# Patient Record
Sex: Female | Born: 1945
Health system: Southern US, Community
[De-identification: ages and names within clinical notes are randomized; demographics above are authoritative.]

## PROBLEM LIST (undated history)

## (undated) DIAGNOSIS — C73 Malignant neoplasm of thyroid gland: Secondary | ICD-10-CM

## (undated) DIAGNOSIS — K589 Irritable bowel syndrome without diarrhea: Secondary | ICD-10-CM

## (undated) DIAGNOSIS — Z87898 Personal history of other specified conditions: Secondary | ICD-10-CM

## (undated) DIAGNOSIS — K219 Gastro-esophageal reflux disease without esophagitis: Secondary | ICD-10-CM

## (undated) DIAGNOSIS — M797 Fibromyalgia: Secondary | ICD-10-CM

## (undated) DIAGNOSIS — F410 Panic disorder [episodic paroxysmal anxiety] without agoraphobia: Secondary | ICD-10-CM

## (undated) DIAGNOSIS — K859 Acute pancreatitis without necrosis or infection, unspecified: Secondary | ICD-10-CM

## (undated) DIAGNOSIS — Z860100 Personal history of colon polyps, unspecified: Secondary | ICD-10-CM

## (undated) DIAGNOSIS — M542 Cervicalgia: Secondary | ICD-10-CM

## (undated) DIAGNOSIS — F419 Anxiety disorder, unspecified: Secondary | ICD-10-CM

## (undated) DIAGNOSIS — Z8701 Personal history of pneumonia (recurrent): Secondary | ICD-10-CM

## (undated) DIAGNOSIS — I10 Essential (primary) hypertension: Secondary | ICD-10-CM

## (undated) DIAGNOSIS — T7840XA Allergy, unspecified, initial encounter: Secondary | ICD-10-CM

## (undated) DIAGNOSIS — C801 Malignant (primary) neoplasm, unspecified: Secondary | ICD-10-CM

## (undated) DIAGNOSIS — I8390 Asymptomatic varicose veins of unspecified lower extremity: Secondary | ICD-10-CM

## (undated) DIAGNOSIS — R519 Headache, unspecified: Secondary | ICD-10-CM

## (undated) DIAGNOSIS — Z86718 Personal history of other venous thrombosis and embolism: Secondary | ICD-10-CM

## (undated) DIAGNOSIS — K5900 Constipation, unspecified: Secondary | ICD-10-CM

## (undated) HISTORY — DX: Personal history of other specified conditions: Z87.898

## (undated) HISTORY — DX: Malignant neoplasm of thyroid gland: C73

## (undated) HISTORY — DX: Anxiety disorder, unspecified: F41.9

## (undated) HISTORY — DX: Personal history of colonic polyps: Z86.010

## (undated) HISTORY — DX: Personal history of other venous thrombosis and embolism: Z86.718

## (undated) HISTORY — DX: Asymptomatic varicose veins of unspecified lower extremity: I83.90

## (undated) HISTORY — DX: Acute pancreatitis without necrosis or infection, unspecified: K85.90

## (undated) HISTORY — DX: Panic disorder (episodic paroxysmal anxiety): F41.0

## (undated) HISTORY — DX: Irritable bowel syndrome, unspecified: K58.9

## (undated) HISTORY — DX: Malignant (primary) neoplasm, unspecified: C80.1

## (undated) HISTORY — DX: Personal history of colon polyps, unspecified: Z86.0100

## (undated) HISTORY — DX: Allergy, unspecified, initial encounter: T78.40XA

## (undated) HISTORY — PX: NERVE TRANSFER FOR MUSCLE REPAIR, FOREARM: SHX2085

## (undated) HISTORY — DX: Cervicalgia: M54.2

## (undated) HISTORY — DX: Gastro-esophageal reflux disease without esophagitis: K21.9

## (undated) HISTORY — PX: THYROIDECTOMY, PARTIAL: SHX18

## (undated) HISTORY — DX: Fibromyalgia: M79.7

## (undated) HISTORY — DX: Headache, unspecified: R51.9

## (undated) HISTORY — DX: Personal history of pneumonia (recurrent): Z87.01

## (undated) HISTORY — DX: Essential (primary) hypertension: I10

## (undated) HISTORY — PX: BREAST IMPLANT REMOVAL: SUR1101

## (undated) HISTORY — PX: SPINE SURGERY: SHX786

## (undated) HISTORY — DX: Constipation, unspecified: K59.00

## (undated) HISTORY — PX: APPENDECTOMY: SHX54

---

## 1972-02-17 HISTORY — PX: TONSILLECTOMY AND ADENOIDECTOMY: SUR1326

## 1975-02-17 HISTORY — PX: TUBAL LIGATION: SHX77

## 1986-02-16 HISTORY — PX: ABDOMINAL HYSTERECTOMY: SHX81

## 1987-02-17 DIAGNOSIS — C801 Malignant (primary) neoplasm, unspecified: Secondary | ICD-10-CM

## 1987-02-17 HISTORY — PX: MASTECTOMY: SHX3

## 1987-02-17 HISTORY — DX: Malignant (primary) neoplasm, unspecified: C80.1

## 1988-02-17 HISTORY — PX: BREAST ENHANCEMENT SURGERY: SHX7

## 1988-02-17 HISTORY — PX: OTHER SURGICAL HISTORY: SHX169

## 1995-02-17 HISTORY — PX: SHOULDER SURGERY: SHX246

## 1998-08-09 ENCOUNTER — Encounter: Payer: Self-pay | Admitting: Gastroenterology

## 1998-08-09 ENCOUNTER — Ambulatory Visit (HOSPITAL_COMMUNITY): Admission: RE | Admit: 1998-08-09 | Discharge: 1998-08-09 | Payer: Self-pay | Admitting: Gastroenterology

## 1998-08-28 ENCOUNTER — Encounter: Payer: Self-pay | Admitting: Gastroenterology

## 1998-08-28 ENCOUNTER — Ambulatory Visit (HOSPITAL_COMMUNITY): Admission: RE | Admit: 1998-08-28 | Discharge: 1998-08-28 | Payer: Self-pay | Admitting: Gastroenterology

## 1998-09-19 ENCOUNTER — Ambulatory Visit (HOSPITAL_COMMUNITY): Admission: RE | Admit: 1998-09-19 | Discharge: 1998-09-19 | Payer: Self-pay | Admitting: Gastroenterology

## 1998-09-19 ENCOUNTER — Encounter (INDEPENDENT_AMBULATORY_CARE_PROVIDER_SITE_OTHER): Payer: Self-pay | Admitting: Specialist

## 1999-05-15 ENCOUNTER — Ambulatory Visit (HOSPITAL_COMMUNITY): Admission: RE | Admit: 1999-05-15 | Discharge: 1999-05-15 | Payer: Self-pay | Admitting: Gastroenterology

## 1999-05-15 ENCOUNTER — Encounter: Payer: Self-pay | Admitting: Gastroenterology

## 1999-12-25 ENCOUNTER — Encounter: Admission: RE | Admit: 1999-12-25 | Discharge: 1999-12-25 | Payer: Self-pay | Admitting: Gastroenterology

## 1999-12-25 ENCOUNTER — Encounter: Payer: Self-pay | Admitting: Gastroenterology

## 2000-08-05 ENCOUNTER — Other Ambulatory Visit: Admission: RE | Admit: 2000-08-05 | Discharge: 2000-08-05 | Payer: Self-pay | Admitting: *Deleted

## 2000-11-03 ENCOUNTER — Encounter: Admission: RE | Admit: 2000-11-03 | Discharge: 2000-11-03 | Payer: Self-pay | Admitting: Gastroenterology

## 2000-11-03 ENCOUNTER — Encounter: Payer: Self-pay | Admitting: Gastroenterology

## 2001-08-10 ENCOUNTER — Other Ambulatory Visit: Admission: RE | Admit: 2001-08-10 | Discharge: 2001-08-10 | Payer: Self-pay | Admitting: Obstetrics and Gynecology

## 2002-06-21 ENCOUNTER — Encounter
Admission: RE | Admit: 2002-06-21 | Discharge: 2002-09-19 | Payer: Self-pay | Admitting: Physical Medicine & Rehabilitation

## 2002-06-28 ENCOUNTER — Ambulatory Visit (HOSPITAL_COMMUNITY)
Admission: RE | Admit: 2002-06-28 | Discharge: 2002-06-28 | Payer: Self-pay | Admitting: Physical Medicine & Rehabilitation

## 2002-06-28 ENCOUNTER — Encounter: Payer: Self-pay | Admitting: Physical Medicine & Rehabilitation

## 2002-12-06 ENCOUNTER — Encounter
Admission: RE | Admit: 2002-12-06 | Discharge: 2003-03-06 | Payer: Self-pay | Admitting: Physical Medicine & Rehabilitation

## 2003-07-02 ENCOUNTER — Ambulatory Visit (HOSPITAL_COMMUNITY): Admission: RE | Admit: 2003-07-02 | Discharge: 2003-07-02 | Payer: Self-pay | Admitting: Radiology

## 2004-06-09 ENCOUNTER — Ambulatory Visit: Payer: Self-pay | Admitting: Family Medicine

## 2004-07-16 ENCOUNTER — Ambulatory Visit: Payer: Self-pay | Admitting: Family Medicine

## 2004-09-24 ENCOUNTER — Ambulatory Visit: Payer: Self-pay | Admitting: Family Medicine

## 2004-10-02 ENCOUNTER — Ambulatory Visit: Payer: Self-pay | Admitting: Family Medicine

## 2004-10-08 ENCOUNTER — Ambulatory Visit: Payer: Self-pay | Admitting: Family Medicine

## 2004-10-30 ENCOUNTER — Ambulatory Visit: Payer: Self-pay | Admitting: Family Medicine

## 2004-12-01 ENCOUNTER — Ambulatory Visit: Payer: Self-pay | Admitting: Family Medicine

## 2005-01-16 ENCOUNTER — Ambulatory Visit: Payer: Self-pay | Admitting: Family Medicine

## 2005-01-19 ENCOUNTER — Ambulatory Visit: Payer: Self-pay | Admitting: Family Medicine

## 2005-01-27 ENCOUNTER — Ambulatory Visit: Payer: Self-pay | Admitting: Family Medicine

## 2005-02-17 ENCOUNTER — Ambulatory Visit: Payer: Self-pay | Admitting: Family Medicine

## 2005-02-23 ENCOUNTER — Ambulatory Visit: Payer: Self-pay | Admitting: Family Medicine

## 2005-03-12 ENCOUNTER — Ambulatory Visit: Payer: Self-pay | Admitting: Family Medicine

## 2006-02-16 HISTORY — PX: COLONOSCOPY: SHX174

## 2006-02-16 HISTORY — PX: CHOLECYSTECTOMY: SHX55

## 2006-02-16 HISTORY — PX: POLYPECTOMY: SHX149

## 2006-03-25 HISTORY — PX: ESOPHAGOGASTRODUODENOSCOPY: SHX1529

## 2009-01-16 ENCOUNTER — Ambulatory Visit (HOSPITAL_COMMUNITY): Admission: RE | Admit: 2009-01-16 | Discharge: 2009-01-17 | Payer: Self-pay | Admitting: Neurosurgery

## 2009-02-16 HISTORY — PX: BREAST LUMPECTOMY: SHX2

## 2009-05-06 ENCOUNTER — Ambulatory Visit (HOSPITAL_BASED_OUTPATIENT_CLINIC_OR_DEPARTMENT_OTHER): Admission: RE | Admit: 2009-05-06 | Discharge: 2009-05-06 | Payer: Self-pay | Admitting: Surgery

## 2010-03-08 ENCOUNTER — Encounter: Payer: Self-pay | Admitting: Internal Medicine

## 2010-05-07 ENCOUNTER — Encounter: Payer: Self-pay | Admitting: Internal Medicine

## 2010-05-12 LAB — POCT HEMOGLOBIN-HEMACUE: Hemoglobin: 14.1 g/dL (ref 12.0–15.0)

## 2010-05-12 LAB — BASIC METABOLIC PANEL
GFR calc non Af Amer: >60 mL/min (ref >60)
Potassium: 4 mEq/L (ref 3.5–5.1)
Sodium: 140 mEq/L (ref 135–145)

## 2010-05-12 LAB — GLUCOSE, CAPILLARY: Glucose-Capillary: 114 mg/dL — ABNORMAL HIGH (ref 70–99)

## 2010-05-15 NOTE — Letter (Signed)
Summary: Colonoscopy Date Change Letter  [facility]  [address] [place], [place] [address]   Phone: [phone]  Fax: [phone]      May 07, 2010 MRN: [id number]   Danielle Mccoy [address] [place], [place]  [address]   Dear [name],   Previously you were recommended to have a repeat colonoscopy around this time. Your chart was recently reviewed by Dr. Lina Sar of [facility]. Follow up colonoscopy is now recommended in March 2015. This revised recommendation is based on current, nationally recognized guidelines for colorectal cancer screening and polyp surveillance. These guidelines are endorsed by the American Cancer Society, [employer] on Colorectal Cancer as well as numerous other major medical organizations.  Please understand that our recommendation assumes that you do not have any new symptoms such as bleeding, a change in bowel habits, anemia, or significant abdominal discomfort. If you do have any concerning GI symptoms or want to discuss the guideline recommendations, please call to arrange an office visit at your earliest convenience. Otherwise we will keep you in our reminder system and contact you 1-2 months prior to the date listed above to schedule your next colonoscopy.  Thank you,  Hedwig Morton. Juanda Chance, M.D.  [facility] [phone]

## 2010-05-20 LAB — GLUCOSE, CAPILLARY
Glucose-Capillary: 114 mg/dL — ABNORMAL HIGH (ref 70–99)
Glucose-Capillary: 191 mg/dL — ABNORMAL HIGH (ref 70–99)

## 2010-05-21 LAB — BASIC METABOLIC PANEL
Calcium: 9.4 mg/dL (ref 8.4–10.5)
GFR calc Af Amer: >60 mL/min (ref >60)
GFR calc non Af Amer: >60 mL/min (ref >60)
Sodium: 139 mEq/L (ref 135–145)

## 2010-05-21 LAB — CBC
Hemoglobin: 14.7 g/dL (ref 12.0–15.0)
RBC: 4.81 MIL/uL (ref 3.87–5.11)
RDW: 13.4 % (ref 11.5–15.5)

## 2010-09-10 ENCOUNTER — Ambulatory Visit: Payer: Self-pay | Admitting: Physical Medicine & Rehabilitation

## 2010-11-03 ENCOUNTER — Other Ambulatory Visit: Payer: Self-pay

## 2010-11-03 DIAGNOSIS — I83812 Varicose veins of left lower extremities with pain: Secondary | ICD-10-CM

## 2010-12-16 ENCOUNTER — Encounter: Payer: Self-pay | Admitting: Vascular Surgery

## 2010-12-17 ENCOUNTER — Encounter: Payer: Self-pay | Admitting: Vascular Surgery

## 2010-12-18 ENCOUNTER — Encounter: Payer: Self-pay | Admitting: Vascular Surgery

## 2010-12-18 ENCOUNTER — Other Ambulatory Visit (INDEPENDENT_AMBULATORY_CARE_PROVIDER_SITE_OTHER): Payer: Medicare Other | Admitting: *Deleted

## 2010-12-18 ENCOUNTER — Ambulatory Visit (INDEPENDENT_AMBULATORY_CARE_PROVIDER_SITE_OTHER): Payer: Medicare Other | Admitting: Vascular Surgery

## 2010-12-18 VITALS — BP 168/82 | HR 67 | Temp 98.1°F

## 2010-12-18 DIAGNOSIS — I83893 Varicose veins of bilateral lower extremities with other complications: Secondary | ICD-10-CM

## 2010-12-18 DIAGNOSIS — I8390 Asymptomatic varicose veins of unspecified lower extremity: Secondary | ICD-10-CM

## 2010-12-18 NOTE — Progress Notes (Signed)
[facility] [place]  Referred by:  Prochnau  Reason for referral: Swollen  Right leg pain  History of Present Illness  Danielle Mccoy is a 65 y.o. female who presents with chief complaint of pain in her right leg.  Patient notes, onset of varicose veins for several years.  She previously had what sounds like sclerotherapy in her right leg several years ago. She has intermittently worn calf and thigh high compression with some relief.   The patient has had noi history of DVT, no history of venous stasis ulcers, no history of  Lymphedema and no history of skin changes in lower legs.  There is family history of varicose veins in her sister, brother,  and father.  She also has a history of neuropathy and has some burning and tingling in her feet on occasion.  She has noted the appearance of varicose veins in her left leg but really doesn't have pain in the left leg at this time.  Chronic medical problems include diabetes, hypertension, and history of remote breast cancer.  These are currently controlled and followed by her primary care doctor.  Past Medical History  Diagnosis Date  . Diabetes mellitus   . Hypertension   . Cancer     breast  . IBS (irritable bowel syndrome)   . Fibromyalgia   . GERD (gastroesophageal reflux disease)   . Cervicalgia   . Anxiety   . Panic attacks   . Varicose veins   . Pancreatitis   . History of pneumonia     Past Surgical History  Procedure Date  . Tonsillectomy and adenoidectomy 1974  . Tubal ligation 1977  . Abdominal hysterectomy 1988  . Nerve transfer for muscle repair, forearm     left arm  . Appendectomy   . Mastectomy 1989  . Cholecystectomy 2008    Gall bladder  . Breast lumpectomy 2011  . Shoulder surgery 1997  . Blood clot removal 1990    right arm  . Spine surgery     nerve cyst removal     History   Social History  . Marital Status: Married    Spouse Name: N/A    Number of Children: N/A  . Years of  Education: N/A   Occupational History  . Not on file.   Social History Main Topics  . Smoking status: Never Smoker   . Smokeless tobacco: Not on file  . Alcohol Use: No  . Drug Use: No  . Sexually Active:    Other Topics Concern  . Not on file   Social History Narrative  . No narrative on file    No family history on file.  Current Outpatient Prescriptions on File Prior to Visit  Medication Sig Dispense Refill  . fluticasone (FLONASE) 50 MCG/ACT nasal spray Place 2 sprays into the nose daily as needed.        Marland Kitchen glipiZIDE (GLUCOTROL XL) 2.5 MG 24 hr tablet Take 2.5 mg by mouth 3 (three) times daily as needed.        Marland Kitchen LORazepam (ATIVAN) 0.5 MG tablet Take 0.5 mg by mouth every 8 (eight) hours.        Marland Kitchen losartan-hydrochlorothiazide (HYZAAR) 100-12.5 MG per tablet Take 1 tablet by mouth daily.        . Milk Thistle 250 MG CAPS Take 250 mg by mouth daily.        . pantoprazole (PROTONIX) 40 MG tablet Take 40 mg by mouth daily.        Marland Kitchen  prochlorperazine (COMPAZINE) 5 MG tablet Take 5 mg by mouth every 6 (six) hours as needed.        . gabapentin (NEURONTIN) 100 MG capsule Take 50 mg by mouth 2 (two) times daily.          Allergies as of 12/18/2010 - Review Complete 12/18/2010  Allergen Reaction Noted  . Anti-inflammatory enzyme  12/16/2010  . Aspirin  12/16/2010  . Cholesterol  12/16/2010  . Depakote  12/16/2010  . Erythromycin  12/16/2010  . Inapsine (droperidol)  12/16/2010  . Methylprednisolone  12/16/2010  . Metronidazole  12/16/2010  . Morphine sulfate Nausea And Vomiting 12/16/2010  . Nortriptyline hcl  12/16/2010  . Omnipaque (iohexol)  12/16/2010  . Smz-tmp ds (sulfamethoxazole w/trimethoprim (co-trimoxazole))  12/16/2010  . Sulfa antibiotics  12/16/2010  . Valium Nausea And Vomiting 12/16/2010     ROS:   General:  No weight loss, Fever, chills  HEENT: No recent headaches, no nasal bleeding, no visual changes, no sore throat  Neurologic: occasional  dizziness and headaches no blackouts, seizures. No recent symptoms of stroke or mini- stroke. No recent episodes of slurred speech, or temporary blindness.  Cardiac: No recent episodes of chest pain/pressure but has had this with anxiety in the past, no shortness of breath at rest.  Does have shortness of breath with exertion.  Denies history of atrial fibrillation or irregular heartbeat  Vascular: No history of rest pain in feet.  No history of claudication.  No history of non-healing ulcer, No history of DVT   Pulmonary: No home oxygen, no productive cough, no hemoptysis,  No asthma or wheezing  Musculoskeletal:  [ ]  Arthritis, [ ]  Low back pain,  [ ]  Joint pain  Hematologic:No history of hypercoagulable state.  No history of easy bleeding.  No history of anemia  Gastrointestinal: No hematochezia or melena,  No gastroesophageal reflux, no trouble swallowing  Urinary: [ ]  chronic Kidney disease, [ ]  on HD - [ ]  MWF or [ ]  TTHS, [ ]  Burning with urination, [ ]  Frequent urination, [ ]  Difficulty urinating;   Skin: No rashes  Psychological: No history of anxiety,  No history of depression  Physical Examination  Filed Vitals:   12/18/10 1130  BP: 168/82  Pulse: 67  Temp: 98.1 F (36.7 C)  SpO2: 99%    There is no height or weight on file to calculate BMI.  General:  Alert and oriented, no acute distress HEENT: Normal Neck: No bruit or JVD Pulmonary: Clear to auscultation bilaterally Cardiac: Regular Rate and Rhythm without murmur Abdomen: Soft, non-tender, non-distended, no mass, no scars Skin: No rash Extremity Pulses:  2+ radial, brachial, femoral, dorsalis pedis, posterior tibial pulses bilaterally, scattered varicosities in the right lateral and medial thigh as well as the right posterior calf. These are all clean 4-5 mm in diameter. There is no surrounding erythema.In the left lower extremity there are varicosities also on the left inner thigh left posterior calf which are  slightly smaller than the right. There are no ulcerations. Musculoskeletal: No deformity or edema  Neurologic: Upper and lower extremity motor 5/5 and symmetric  DATA: Patient had a venous duplex exam today the right lower extremity which I reviewed and interpreted. This showed no evidence of DVT. This showed reflux in the right greater saphenous vein.  Assessment: Symptomatic varicose veins right lower extremity. Pathophysiology of varicose veins was explained to the patient today. I also emphasized to her that the mainstay of therapy would be compression. I  also reassured her that varicose veins do not put her repeat increased risk of pulmonary embolus.  Plan: The patient was measured today for thigh high 25-30 mm compression stockings and she will wear these continuously when she is going to be up on her feet. She'll return for a followup visit with Dr. Hart Rochester in 3 months time to consider whether or not laser ablation would be a good treatment for her greater saphenous reflux.  Fabienne Bruns, MD [facility] [place] Office: [phone] Pager: [phone]

## 2010-12-26 NOTE — Procedures (Unsigned)
LOWER EXTREMITY VENOUS REFLUX EXAM  INDICATION:  Varicose veins.  EXAM:  Using color-flow imaging and pulse Doppler spectral analysis, the right common femoral, superficial femoral, popliteal, posterior tibial, greater and lesser saphenous veins are evaluated.  There is no evidence suggesting deep venous insufficiency in the right lower extremity.  The right saphenofemoral junction is competent.  The right nontortuous great saphenous vein demonstrates reflux of  >528milliseconds diameter measurements as described below.  The right proximal short saphenous vein demonstrates competency.  GSV Diameter (used if found to be incompetent only)                                           Right    Left Proximal Greater Saphenous Vein           0.47 cm  cm Proximal-to-mid-thigh                     cm       cm Mid thigh                                 0.44 cm  cm Mid-distal thigh                          cm       cm Distal thigh                              0.49 cm  cm Knee                                      0.37 cm  cm  IMPRESSION:  Right great saphenous vein reflux is noted, as described above.  ___________________________________________ Janetta Hora. Fields, MD  CH/MEDQ  D:  12/19/2010  T:  12/19/2010  Job:  [id number]

## 2010-12-27 IMAGING — CR DG LUMBAR SPINE 1V
1 series · 1 of 1 positions shown · non-contrast
Comparison: None

CLINICAL DATA: Lumbar HNP.  L4-L5 synovial cyst excision.

LUMBAR SPINE - 1 VIEW

[view not recorded]
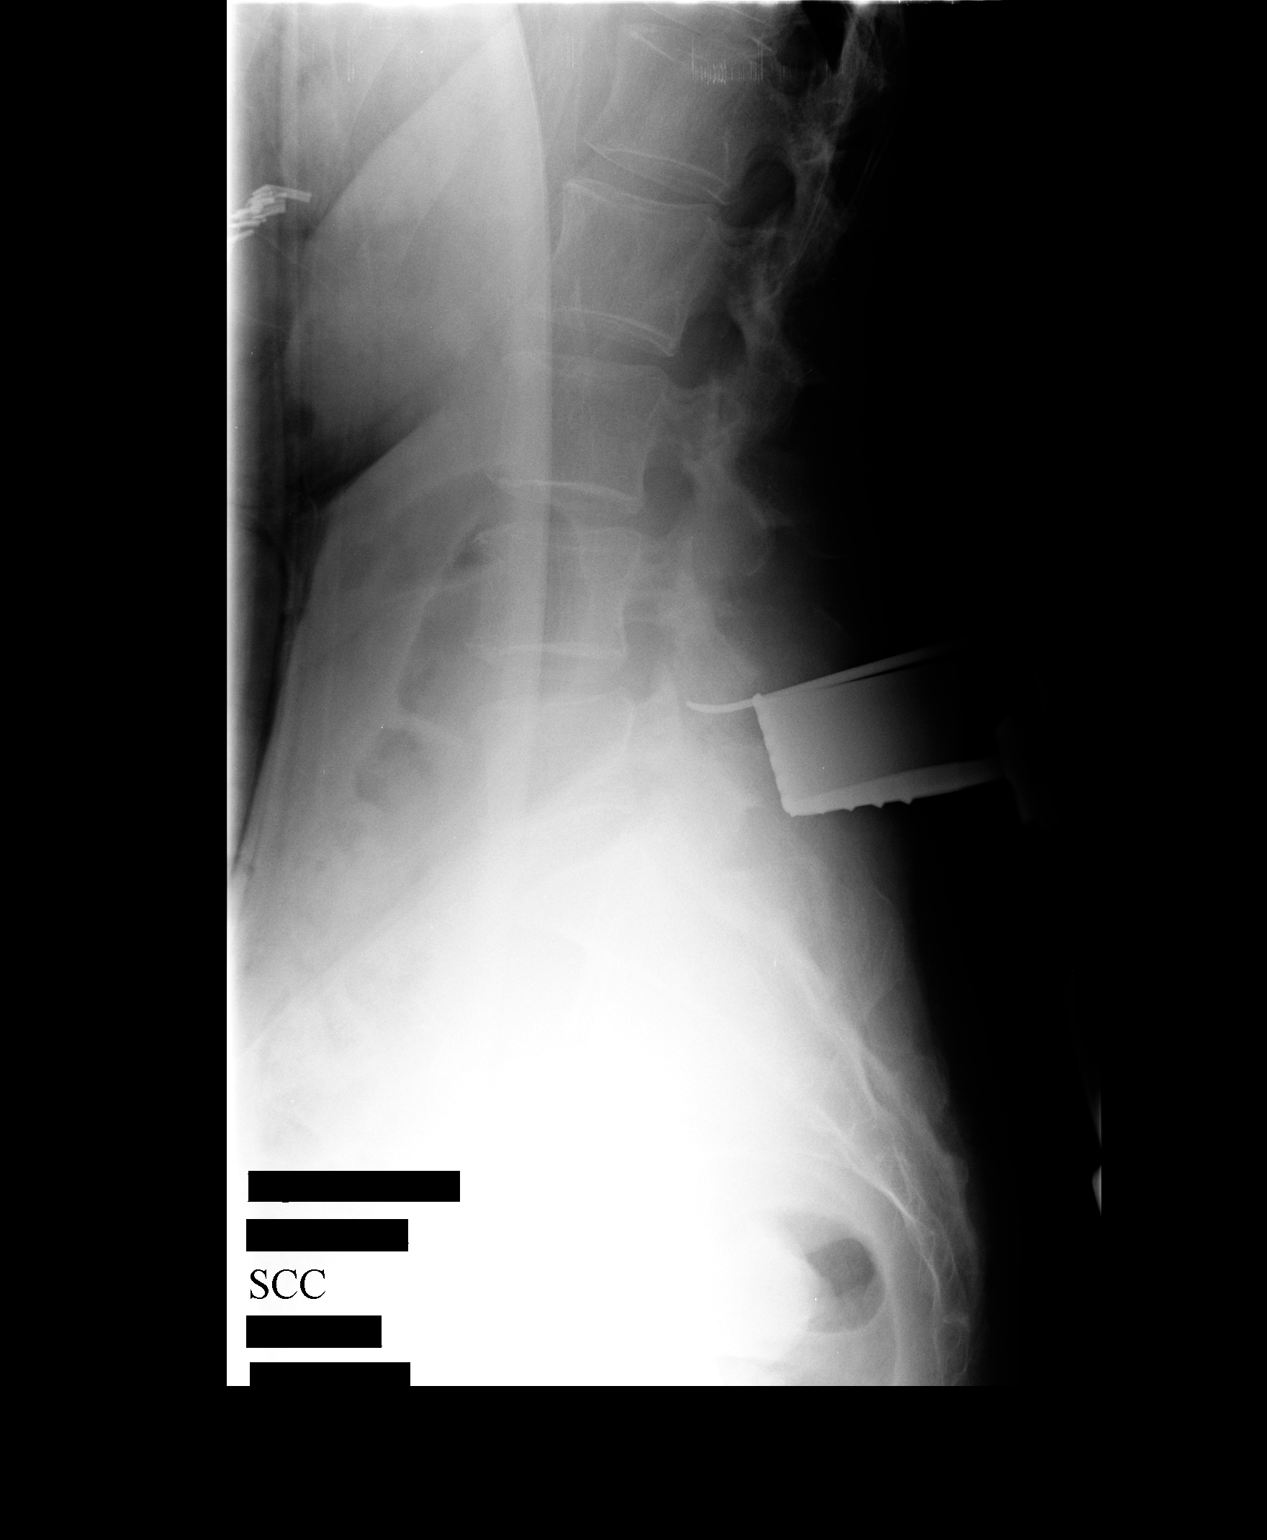

[1 of 1 positions shown; findings below may reference images not displayed]

FINDINGS: Single intraoperative lateral view, labeled 1272 hours,
demonstrates a surgical device projecting posterior to the L4-L5
junction.
IMPRESSION: Intraoperative localization, L4-L5.

## 2011-03-02 DIAGNOSIS — Z853 Personal history of malignant neoplasm of breast: Secondary | ICD-10-CM | POA: Diagnosis not present

## 2011-03-26 ENCOUNTER — Ambulatory Visit: Payer: PRIVATE HEALTH INSURANCE | Admitting: Vascular Surgery

## 2011-03-26 DIAGNOSIS — M25519 Pain in unspecified shoulder: Secondary | ICD-10-CM | POA: Diagnosis not present

## 2011-03-30 DIAGNOSIS — M25519 Pain in unspecified shoulder: Secondary | ICD-10-CM | POA: Diagnosis not present

## 2011-06-15 DIAGNOSIS — D234 Other benign neoplasm of skin of scalp and neck: Secondary | ICD-10-CM | POA: Diagnosis not present

## 2011-06-15 DIAGNOSIS — L821 Other seborrheic keratosis: Secondary | ICD-10-CM | POA: Diagnosis not present

## 2011-06-25 DIAGNOSIS — E785 Hyperlipidemia, unspecified: Secondary | ICD-10-CM | POA: Diagnosis not present

## 2011-06-25 DIAGNOSIS — Z79899 Other long term (current) drug therapy: Secondary | ICD-10-CM | POA: Diagnosis not present

## 2011-06-25 DIAGNOSIS — F411 Generalized anxiety disorder: Secondary | ICD-10-CM | POA: Diagnosis not present

## 2011-06-25 DIAGNOSIS — E119 Type 2 diabetes mellitus without complications: Secondary | ICD-10-CM | POA: Diagnosis not present

## 2011-06-25 DIAGNOSIS — M549 Dorsalgia, unspecified: Secondary | ICD-10-CM | POA: Diagnosis not present

## 2011-06-25 DIAGNOSIS — K219 Gastro-esophageal reflux disease without esophagitis: Secondary | ICD-10-CM | POA: Diagnosis not present

## 2011-06-25 DIAGNOSIS — I10 Essential (primary) hypertension: Secondary | ICD-10-CM | POA: Diagnosis not present

## 2011-11-30 DIAGNOSIS — Z79899 Other long term (current) drug therapy: Secondary | ICD-10-CM | POA: Diagnosis not present

## 2011-11-30 DIAGNOSIS — I10 Essential (primary) hypertension: Secondary | ICD-10-CM | POA: Diagnosis not present

## 2011-11-30 DIAGNOSIS — K219 Gastro-esophageal reflux disease without esophagitis: Secondary | ICD-10-CM | POA: Diagnosis not present

## 2011-11-30 DIAGNOSIS — R229 Localized swelling, mass and lump, unspecified: Secondary | ICD-10-CM | POA: Diagnosis not present

## 2011-12-21 DIAGNOSIS — D1739 Benign lipomatous neoplasm of skin and subcutaneous tissue of other sites: Secondary | ICD-10-CM | POA: Diagnosis not present

## 2011-12-28 DIAGNOSIS — Z124 Encounter for screening for malignant neoplasm of cervix: Secondary | ICD-10-CM | POA: Diagnosis not present

## 2011-12-28 DIAGNOSIS — Z0000 Encounter for general adult medical examination without abnormal findings: Secondary | ICD-10-CM | POA: Diagnosis not present

## 2012-02-18 DIAGNOSIS — R110 Nausea: Secondary | ICD-10-CM | POA: Diagnosis not present

## 2012-02-18 DIAGNOSIS — E119 Type 2 diabetes mellitus without complications: Secondary | ICD-10-CM | POA: Diagnosis not present

## 2012-02-18 DIAGNOSIS — H6980 Other specified disorders of Eustachian tube, unspecified ear: Secondary | ICD-10-CM | POA: Diagnosis not present

## 2012-02-18 DIAGNOSIS — I10 Essential (primary) hypertension: Secondary | ICD-10-CM | POA: Diagnosis not present

## 2012-02-18 DIAGNOSIS — K219 Gastro-esophageal reflux disease without esophagitis: Secondary | ICD-10-CM | POA: Diagnosis not present

## 2012-03-14 DIAGNOSIS — Z853 Personal history of malignant neoplasm of breast: Secondary | ICD-10-CM | POA: Diagnosis not present

## 2012-03-21 DIAGNOSIS — K219 Gastro-esophageal reflux disease without esophagitis: Secondary | ICD-10-CM | POA: Diagnosis not present

## 2012-03-21 DIAGNOSIS — Z79899 Other long term (current) drug therapy: Secondary | ICD-10-CM | POA: Diagnosis not present

## 2012-03-21 DIAGNOSIS — H6980 Other specified disorders of Eustachian tube, unspecified ear: Secondary | ICD-10-CM | POA: Diagnosis not present

## 2012-03-21 DIAGNOSIS — I10 Essential (primary) hypertension: Secondary | ICD-10-CM | POA: Diagnosis not present

## 2012-05-02 DIAGNOSIS — R310 Gross hematuria: Secondary | ICD-10-CM | POA: Diagnosis not present

## 2012-05-16 DIAGNOSIS — N390 Urinary tract infection, site not specified: Secondary | ICD-10-CM | POA: Diagnosis not present

## 2012-06-06 DIAGNOSIS — H2510 Age-related nuclear cataract, unspecified eye: Secondary | ICD-10-CM | POA: Diagnosis not present

## 2012-06-16 DIAGNOSIS — D235 Other benign neoplasm of skin of trunk: Secondary | ICD-10-CM | POA: Diagnosis not present

## 2012-06-16 DIAGNOSIS — L820 Inflamed seborrheic keratosis: Secondary | ICD-10-CM | POA: Diagnosis not present

## 2012-06-16 DIAGNOSIS — L723 Sebaceous cyst: Secondary | ICD-10-CM | POA: Diagnosis not present

## 2012-08-29 DIAGNOSIS — IMO0001 Reserved for inherently not codable concepts without codable children: Secondary | ICD-10-CM | POA: Diagnosis not present

## 2012-08-29 DIAGNOSIS — E785 Hyperlipidemia, unspecified: Secondary | ICD-10-CM | POA: Diagnosis not present

## 2012-08-29 DIAGNOSIS — I10 Essential (primary) hypertension: Secondary | ICD-10-CM | POA: Diagnosis not present

## 2012-08-29 DIAGNOSIS — Z79899 Other long term (current) drug therapy: Secondary | ICD-10-CM | POA: Diagnosis not present

## 2012-09-01 DIAGNOSIS — E785 Hyperlipidemia, unspecified: Secondary | ICD-10-CM | POA: Diagnosis not present

## 2012-09-01 DIAGNOSIS — I10 Essential (primary) hypertension: Secondary | ICD-10-CM | POA: Diagnosis not present

## 2012-09-01 DIAGNOSIS — Z853 Personal history of malignant neoplasm of breast: Secondary | ICD-10-CM | POA: Diagnosis not present

## 2012-09-01 DIAGNOSIS — K219 Gastro-esophageal reflux disease without esophagitis: Secondary | ICD-10-CM | POA: Diagnosis not present

## 2012-12-05 DIAGNOSIS — IMO0001 Reserved for inherently not codable concepts without codable children: Secondary | ICD-10-CM | POA: Diagnosis not present

## 2012-12-05 DIAGNOSIS — I10 Essential (primary) hypertension: Secondary | ICD-10-CM | POA: Diagnosis not present

## 2012-12-05 DIAGNOSIS — Z79899 Other long term (current) drug therapy: Secondary | ICD-10-CM | POA: Diagnosis not present

## 2012-12-05 DIAGNOSIS — K219 Gastro-esophageal reflux disease without esophagitis: Secondary | ICD-10-CM | POA: Diagnosis not present

## 2012-12-08 DIAGNOSIS — Z23 Encounter for immunization: Secondary | ICD-10-CM | POA: Diagnosis not present

## 2013-02-27 DIAGNOSIS — M25519 Pain in unspecified shoulder: Secondary | ICD-10-CM | POA: Diagnosis not present

## 2013-02-27 DIAGNOSIS — K644 Residual hemorrhoidal skin tags: Secondary | ICD-10-CM | POA: Diagnosis not present

## 2013-02-27 DIAGNOSIS — IMO0001 Reserved for inherently not codable concepts without codable children: Secondary | ICD-10-CM | POA: Diagnosis not present

## 2013-02-28 DIAGNOSIS — M259 Joint disorder, unspecified: Secondary | ICD-10-CM | POA: Diagnosis not present

## 2013-02-28 DIAGNOSIS — M25519 Pain in unspecified shoulder: Secondary | ICD-10-CM | POA: Diagnosis not present

## 2013-03-17 ENCOUNTER — Encounter: Payer: Self-pay | Admitting: Internal Medicine

## 2013-03-28 DIAGNOSIS — N390 Urinary tract infection, site not specified: Secondary | ICD-10-CM | POA: Diagnosis not present

## 2013-04-03 DIAGNOSIS — IMO0001 Reserved for inherently not codable concepts without codable children: Secondary | ICD-10-CM | POA: Diagnosis not present

## 2013-04-03 DIAGNOSIS — R1011 Right upper quadrant pain: Secondary | ICD-10-CM | POA: Diagnosis not present

## 2013-04-03 DIAGNOSIS — I10 Essential (primary) hypertension: Secondary | ICD-10-CM | POA: Diagnosis not present

## 2013-04-03 DIAGNOSIS — Z79899 Other long term (current) drug therapy: Secondary | ICD-10-CM | POA: Diagnosis not present

## 2013-04-03 DIAGNOSIS — R300 Dysuria: Secondary | ICD-10-CM | POA: Diagnosis not present

## 2013-04-03 DIAGNOSIS — E785 Hyperlipidemia, unspecified: Secondary | ICD-10-CM | POA: Diagnosis not present

## 2013-04-12 DIAGNOSIS — R300 Dysuria: Secondary | ICD-10-CM | POA: Diagnosis not present

## 2013-04-12 DIAGNOSIS — R1011 Right upper quadrant pain: Secondary | ICD-10-CM | POA: Diagnosis not present

## 2013-05-03 DIAGNOSIS — M25819 Other specified joint disorders, unspecified shoulder: Secondary | ICD-10-CM | POA: Diagnosis not present

## 2013-05-22 DIAGNOSIS — Z853 Personal history of malignant neoplasm of breast: Secondary | ICD-10-CM | POA: Diagnosis not present

## 2013-05-22 DIAGNOSIS — Z1231 Encounter for screening mammogram for malignant neoplasm of breast: Secondary | ICD-10-CM | POA: Diagnosis not present

## 2013-05-31 DIAGNOSIS — M25819 Other specified joint disorders, unspecified shoulder: Secondary | ICD-10-CM | POA: Diagnosis not present

## 2013-07-03 DIAGNOSIS — R350 Frequency of micturition: Secondary | ICD-10-CM | POA: Diagnosis not present

## 2013-07-03 DIAGNOSIS — Z79899 Other long term (current) drug therapy: Secondary | ICD-10-CM | POA: Diagnosis not present

## 2013-07-03 DIAGNOSIS — I10 Essential (primary) hypertension: Secondary | ICD-10-CM | POA: Diagnosis not present

## 2013-07-03 DIAGNOSIS — J309 Allergic rhinitis, unspecified: Secondary | ICD-10-CM | POA: Diagnosis not present

## 2013-07-03 DIAGNOSIS — E119 Type 2 diabetes mellitus without complications: Secondary | ICD-10-CM | POA: Diagnosis not present

## 2013-07-03 DIAGNOSIS — L2089 Other atopic dermatitis: Secondary | ICD-10-CM | POA: Diagnosis not present

## 2013-07-03 DIAGNOSIS — E785 Hyperlipidemia, unspecified: Secondary | ICD-10-CM | POA: Diagnosis not present

## 2013-07-04 DIAGNOSIS — C44319 Basal cell carcinoma of skin of other parts of face: Secondary | ICD-10-CM | POA: Diagnosis not present

## 2013-07-04 DIAGNOSIS — L988 Other specified disorders of the skin and subcutaneous tissue: Secondary | ICD-10-CM | POA: Diagnosis not present

## 2013-07-04 DIAGNOSIS — L820 Inflamed seborrheic keratosis: Secondary | ICD-10-CM | POA: Diagnosis not present

## 2013-07-04 DIAGNOSIS — D2360 Other benign neoplasm of skin of unspecified upper limb, including shoulder: Secondary | ICD-10-CM | POA: Diagnosis not present

## 2013-07-04 DIAGNOSIS — D485 Neoplasm of uncertain behavior of skin: Secondary | ICD-10-CM | POA: Diagnosis not present

## 2013-07-13 DIAGNOSIS — H40019 Open angle with borderline findings, low risk, unspecified eye: Secondary | ICD-10-CM | POA: Diagnosis not present

## 2013-07-13 DIAGNOSIS — I10 Essential (primary) hypertension: Secondary | ICD-10-CM | POA: Diagnosis not present

## 2013-07-13 DIAGNOSIS — H2589 Other age-related cataract: Secondary | ICD-10-CM | POA: Diagnosis not present

## 2013-07-13 DIAGNOSIS — E119 Type 2 diabetes mellitus without complications: Secondary | ICD-10-CM | POA: Diagnosis not present

## 2013-09-05 DIAGNOSIS — D485 Neoplasm of uncertain behavior of skin: Secondary | ICD-10-CM | POA: Diagnosis not present

## 2013-10-09 DIAGNOSIS — H40019 Open angle with borderline findings, low risk, unspecified eye: Secondary | ICD-10-CM | POA: Diagnosis not present

## 2013-11-06 DIAGNOSIS — E785 Hyperlipidemia, unspecified: Secondary | ICD-10-CM | POA: Diagnosis not present

## 2013-11-06 DIAGNOSIS — IMO0001 Reserved for inherently not codable concepts without codable children: Secondary | ICD-10-CM | POA: Diagnosis not present

## 2013-11-06 DIAGNOSIS — I10 Essential (primary) hypertension: Secondary | ICD-10-CM | POA: Diagnosis not present

## 2013-11-06 DIAGNOSIS — E119 Type 2 diabetes mellitus without complications: Secondary | ICD-10-CM | POA: Diagnosis not present

## 2013-11-06 DIAGNOSIS — J309 Allergic rhinitis, unspecified: Secondary | ICD-10-CM | POA: Diagnosis not present

## 2013-11-06 DIAGNOSIS — K5900 Constipation, unspecified: Secondary | ICD-10-CM | POA: Diagnosis not present

## 2013-11-06 DIAGNOSIS — Z79899 Other long term (current) drug therapy: Secondary | ICD-10-CM | POA: Diagnosis not present

## 2013-11-21 DIAGNOSIS — J180 Bronchopneumonia, unspecified organism: Secondary | ICD-10-CM | POA: Diagnosis not present

## 2013-11-21 DIAGNOSIS — R079 Chest pain, unspecified: Secondary | ICD-10-CM | POA: Diagnosis not present

## 2013-11-21 DIAGNOSIS — R05 Cough: Secondary | ICD-10-CM | POA: Diagnosis not present

## 2013-11-23 DIAGNOSIS — E119 Type 2 diabetes mellitus without complications: Secondary | ICD-10-CM | POA: Diagnosis not present

## 2013-11-23 DIAGNOSIS — Z8719 Personal history of other diseases of the digestive system: Secondary | ICD-10-CM | POA: Diagnosis not present

## 2013-11-29 ENCOUNTER — Encounter: Payer: Self-pay | Admitting: Internal Medicine

## 2013-12-14 DIAGNOSIS — B373 Candidiasis of vulva and vagina: Secondary | ICD-10-CM | POA: Diagnosis not present

## 2013-12-14 DIAGNOSIS — J0191 Acute recurrent sinusitis, unspecified: Secondary | ICD-10-CM | POA: Diagnosis not present

## 2013-12-26 DIAGNOSIS — F09 Unspecified mental disorder due to known physiological condition: Secondary | ICD-10-CM | POA: Diagnosis not present

## 2013-12-26 DIAGNOSIS — E1165 Type 2 diabetes mellitus with hyperglycemia: Secondary | ICD-10-CM | POA: Diagnosis not present

## 2013-12-26 DIAGNOSIS — C50911 Malignant neoplasm of unspecified site of right female breast: Secondary | ICD-10-CM | POA: Diagnosis not present

## 2013-12-26 DIAGNOSIS — F329 Major depressive disorder, single episode, unspecified: Secondary | ICD-10-CM | POA: Diagnosis not present

## 2014-01-09 DIAGNOSIS — I10 Essential (primary) hypertension: Secondary | ICD-10-CM | POA: Diagnosis not present

## 2014-01-09 DIAGNOSIS — H40013 Open angle with borderline findings, low risk, bilateral: Secondary | ICD-10-CM | POA: Diagnosis not present

## 2014-01-19 DIAGNOSIS — M2669 Other specified disorders of temporomandibular joint: Secondary | ICD-10-CM | POA: Diagnosis not present

## 2014-01-19 DIAGNOSIS — H9203 Otalgia, bilateral: Secondary | ICD-10-CM | POA: Diagnosis not present

## 2014-01-19 DIAGNOSIS — H8190 Unspecified disorder of vestibular function, unspecified ear: Secondary | ICD-10-CM | POA: Diagnosis not present

## 2014-01-19 DIAGNOSIS — J342 Deviated nasal septum: Secondary | ICD-10-CM | POA: Diagnosis not present

## 2014-03-01 DIAGNOSIS — M549 Dorsalgia, unspecified: Secondary | ICD-10-CM | POA: Diagnosis not present

## 2014-03-01 DIAGNOSIS — E876 Hypokalemia: Secondary | ICD-10-CM | POA: Diagnosis not present

## 2014-03-01 DIAGNOSIS — R109 Unspecified abdominal pain: Secondary | ICD-10-CM | POA: Diagnosis not present

## 2014-03-01 DIAGNOSIS — K219 Gastro-esophageal reflux disease without esophagitis: Secondary | ICD-10-CM | POA: Diagnosis not present

## 2014-03-01 DIAGNOSIS — E1165 Type 2 diabetes mellitus with hyperglycemia: Secondary | ICD-10-CM | POA: Diagnosis not present

## 2014-03-01 DIAGNOSIS — Z79899 Other long term (current) drug therapy: Secondary | ICD-10-CM | POA: Diagnosis not present

## 2014-03-01 DIAGNOSIS — I10 Essential (primary) hypertension: Secondary | ICD-10-CM | POA: Diagnosis not present

## 2014-03-06 DIAGNOSIS — M5136 Other intervertebral disc degeneration, lumbar region: Secondary | ICD-10-CM | POA: Diagnosis not present

## 2014-03-06 DIAGNOSIS — M549 Dorsalgia, unspecified: Secondary | ICD-10-CM | POA: Diagnosis not present

## 2014-03-06 DIAGNOSIS — M47897 Other spondylosis, lumbosacral region: Secondary | ICD-10-CM | POA: Diagnosis not present

## 2014-04-03 DIAGNOSIS — I10 Essential (primary) hypertension: Secondary | ICD-10-CM | POA: Diagnosis not present

## 2014-04-03 DIAGNOSIS — R1013 Epigastric pain: Secondary | ICD-10-CM | POA: Diagnosis not present

## 2014-04-03 DIAGNOSIS — E1165 Type 2 diabetes mellitus with hyperglycemia: Secondary | ICD-10-CM | POA: Diagnosis not present

## 2014-04-03 DIAGNOSIS — E559 Vitamin D deficiency, unspecified: Secondary | ICD-10-CM | POA: Diagnosis not present

## 2014-04-03 DIAGNOSIS — F09 Unspecified mental disorder due to known physiological condition: Secondary | ICD-10-CM | POA: Diagnosis not present

## 2014-04-03 DIAGNOSIS — F450 Somatization disorder: Secondary | ICD-10-CM | POA: Diagnosis not present

## 2014-05-01 DIAGNOSIS — E1165 Type 2 diabetes mellitus with hyperglycemia: Secondary | ICD-10-CM | POA: Diagnosis not present

## 2014-05-23 DIAGNOSIS — E1165 Type 2 diabetes mellitus with hyperglycemia: Secondary | ICD-10-CM | POA: Diagnosis not present

## 2014-06-06 DIAGNOSIS — Z1231 Encounter for screening mammogram for malignant neoplasm of breast: Secondary | ICD-10-CM | POA: Diagnosis not present

## 2014-06-06 DIAGNOSIS — Z853 Personal history of malignant neoplasm of breast: Secondary | ICD-10-CM | POA: Diagnosis not present

## 2014-06-27 DIAGNOSIS — I10 Essential (primary) hypertension: Secondary | ICD-10-CM | POA: Diagnosis not present

## 2014-06-27 DIAGNOSIS — M797 Fibromyalgia: Secondary | ICD-10-CM | POA: Diagnosis not present

## 2014-06-27 DIAGNOSIS — E559 Vitamin D deficiency, unspecified: Secondary | ICD-10-CM | POA: Diagnosis not present

## 2014-06-27 DIAGNOSIS — Z79899 Other long term (current) drug therapy: Secondary | ICD-10-CM | POA: Diagnosis not present

## 2014-06-27 DIAGNOSIS — K219 Gastro-esophageal reflux disease without esophagitis: Secondary | ICD-10-CM | POA: Diagnosis not present

## 2014-06-27 DIAGNOSIS — E1165 Type 2 diabetes mellitus with hyperglycemia: Secondary | ICD-10-CM | POA: Diagnosis not present

## 2014-06-28 DIAGNOSIS — Z86010 Personal history of colonic polyps: Secondary | ICD-10-CM | POA: Diagnosis not present

## 2014-06-28 DIAGNOSIS — K589 Irritable bowel syndrome without diarrhea: Secondary | ICD-10-CM | POA: Diagnosis not present

## 2014-07-02 DIAGNOSIS — I10 Essential (primary) hypertension: Secondary | ICD-10-CM | POA: Diagnosis not present

## 2014-07-02 DIAGNOSIS — E1165 Type 2 diabetes mellitus with hyperglycemia: Secondary | ICD-10-CM | POA: Diagnosis not present

## 2014-07-02 DIAGNOSIS — F09 Unspecified mental disorder due to known physiological condition: Secondary | ICD-10-CM | POA: Diagnosis not present

## 2014-07-02 DIAGNOSIS — F450 Somatization disorder: Secondary | ICD-10-CM | POA: Diagnosis not present

## 2014-07-04 DIAGNOSIS — M797 Fibromyalgia: Secondary | ICD-10-CM | POA: Diagnosis not present

## 2014-07-09 DIAGNOSIS — M797 Fibromyalgia: Secondary | ICD-10-CM | POA: Diagnosis not present

## 2014-07-12 DIAGNOSIS — M797 Fibromyalgia: Secondary | ICD-10-CM | POA: Diagnosis not present

## 2014-07-17 DIAGNOSIS — M797 Fibromyalgia: Secondary | ICD-10-CM | POA: Diagnosis not present

## 2014-07-19 DIAGNOSIS — M797 Fibromyalgia: Secondary | ICD-10-CM | POA: Diagnosis not present

## 2014-07-26 DIAGNOSIS — M797 Fibromyalgia: Secondary | ICD-10-CM | POA: Diagnosis not present

## 2014-07-31 DIAGNOSIS — E1165 Type 2 diabetes mellitus with hyperglycemia: Secondary | ICD-10-CM | POA: Diagnosis not present

## 2014-08-06 DIAGNOSIS — M797 Fibromyalgia: Secondary | ICD-10-CM | POA: Diagnosis not present

## 2014-08-10 DIAGNOSIS — M797 Fibromyalgia: Secondary | ICD-10-CM | POA: Diagnosis not present

## 2014-08-13 DIAGNOSIS — M797 Fibromyalgia: Secondary | ICD-10-CM | POA: Diagnosis not present

## 2014-08-16 DIAGNOSIS — M797 Fibromyalgia: Secondary | ICD-10-CM | POA: Diagnosis not present

## 2014-08-24 DIAGNOSIS — M797 Fibromyalgia: Secondary | ICD-10-CM | POA: Diagnosis not present

## 2014-09-05 DIAGNOSIS — M797 Fibromyalgia: Secondary | ICD-10-CM | POA: Diagnosis not present

## 2014-09-07 DIAGNOSIS — M797 Fibromyalgia: Secondary | ICD-10-CM | POA: Diagnosis not present

## 2014-09-10 DIAGNOSIS — M797 Fibromyalgia: Secondary | ICD-10-CM | POA: Diagnosis not present

## 2014-09-17 DIAGNOSIS — M797 Fibromyalgia: Secondary | ICD-10-CM | POA: Diagnosis not present

## 2014-09-24 DIAGNOSIS — M797 Fibromyalgia: Secondary | ICD-10-CM | POA: Diagnosis not present

## 2014-10-01 DIAGNOSIS — M797 Fibromyalgia: Secondary | ICD-10-CM | POA: Diagnosis not present

## 2014-10-08 DIAGNOSIS — M797 Fibromyalgia: Secondary | ICD-10-CM | POA: Diagnosis not present

## 2014-10-10 DIAGNOSIS — E1165 Type 2 diabetes mellitus with hyperglycemia: Secondary | ICD-10-CM | POA: Diagnosis not present

## 2014-10-10 DIAGNOSIS — I10 Essential (primary) hypertension: Secondary | ICD-10-CM | POA: Diagnosis not present

## 2014-10-10 DIAGNOSIS — F329 Major depressive disorder, single episode, unspecified: Secondary | ICD-10-CM | POA: Diagnosis not present

## 2014-10-10 DIAGNOSIS — F09 Unspecified mental disorder due to known physiological condition: Secondary | ICD-10-CM | POA: Diagnosis not present

## 2014-10-11 DIAGNOSIS — M25572 Pain in left ankle and joints of left foot: Secondary | ICD-10-CM | POA: Diagnosis not present

## 2014-10-29 DIAGNOSIS — M797 Fibromyalgia: Secondary | ICD-10-CM | POA: Diagnosis not present

## 2014-10-31 DIAGNOSIS — E1165 Type 2 diabetes mellitus with hyperglycemia: Secondary | ICD-10-CM | POA: Diagnosis not present

## 2014-10-31 DIAGNOSIS — M791 Myalgia: Secondary | ICD-10-CM | POA: Diagnosis not present

## 2014-10-31 DIAGNOSIS — F419 Anxiety disorder, unspecified: Secondary | ICD-10-CM | POA: Diagnosis not present

## 2014-10-31 DIAGNOSIS — R531 Weakness: Secondary | ICD-10-CM | POA: Diagnosis not present

## 2014-10-31 DIAGNOSIS — J0100 Acute maxillary sinusitis, unspecified: Secondary | ICD-10-CM | POA: Diagnosis not present

## 2014-10-31 DIAGNOSIS — Z79899 Other long term (current) drug therapy: Secondary | ICD-10-CM | POA: Diagnosis not present

## 2014-10-31 DIAGNOSIS — E559 Vitamin D deficiency, unspecified: Secondary | ICD-10-CM | POA: Diagnosis not present

## 2014-10-31 DIAGNOSIS — I10 Essential (primary) hypertension: Secondary | ICD-10-CM | POA: Diagnosis not present

## 2014-10-31 DIAGNOSIS — R5383 Other fatigue: Secondary | ICD-10-CM | POA: Diagnosis not present

## 2014-10-31 DIAGNOSIS — K219 Gastro-esophageal reflux disease without esophagitis: Secondary | ICD-10-CM | POA: Diagnosis not present

## 2014-11-01 DIAGNOSIS — H524 Presbyopia: Secondary | ICD-10-CM | POA: Diagnosis not present

## 2014-11-01 DIAGNOSIS — H43313 Vitreous membranes and strands, bilateral: Secondary | ICD-10-CM | POA: Diagnosis not present

## 2014-11-01 DIAGNOSIS — R531 Weakness: Secondary | ICD-10-CM | POA: Diagnosis not present

## 2014-11-01 DIAGNOSIS — H25813 Combined forms of age-related cataract, bilateral: Secondary | ICD-10-CM | POA: Diagnosis not present

## 2014-11-01 DIAGNOSIS — E559 Vitamin D deficiency, unspecified: Secondary | ICD-10-CM | POA: Diagnosis not present

## 2014-11-01 DIAGNOSIS — Z79899 Other long term (current) drug therapy: Secondary | ICD-10-CM | POA: Diagnosis not present

## 2014-11-01 DIAGNOSIS — E119 Type 2 diabetes mellitus without complications: Secondary | ICD-10-CM | POA: Diagnosis not present

## 2014-11-01 DIAGNOSIS — M791 Myalgia: Secondary | ICD-10-CM | POA: Diagnosis not present

## 2014-11-01 DIAGNOSIS — I10 Essential (primary) hypertension: Secondary | ICD-10-CM | POA: Diagnosis not present

## 2014-11-01 DIAGNOSIS — H43393 Other vitreous opacities, bilateral: Secondary | ICD-10-CM | POA: Diagnosis not present

## 2014-11-01 DIAGNOSIS — H5203 Hypermetropia, bilateral: Secondary | ICD-10-CM | POA: Diagnosis not present

## 2014-11-01 DIAGNOSIS — R5383 Other fatigue: Secondary | ICD-10-CM | POA: Diagnosis not present

## 2014-11-01 DIAGNOSIS — H52222 Regular astigmatism, left eye: Secondary | ICD-10-CM | POA: Diagnosis not present

## 2014-11-01 DIAGNOSIS — H35373 Puckering of macula, bilateral: Secondary | ICD-10-CM | POA: Diagnosis not present

## 2014-11-01 DIAGNOSIS — H43813 Vitreous degeneration, bilateral: Secondary | ICD-10-CM | POA: Diagnosis not present

## 2014-11-05 DIAGNOSIS — M797 Fibromyalgia: Secondary | ICD-10-CM | POA: Diagnosis not present

## 2014-11-07 DIAGNOSIS — M797 Fibromyalgia: Secondary | ICD-10-CM | POA: Diagnosis not present

## 2014-11-19 DIAGNOSIS — M797 Fibromyalgia: Secondary | ICD-10-CM | POA: Diagnosis not present

## 2014-11-28 DIAGNOSIS — E039 Hypothyroidism, unspecified: Secondary | ICD-10-CM | POA: Diagnosis not present

## 2014-12-04 DIAGNOSIS — M797 Fibromyalgia: Secondary | ICD-10-CM | POA: Diagnosis not present

## 2014-12-18 DIAGNOSIS — M797 Fibromyalgia: Secondary | ICD-10-CM | POA: Diagnosis not present

## 2015-01-07 DIAGNOSIS — M797 Fibromyalgia: Secondary | ICD-10-CM | POA: Diagnosis not present

## 2015-01-16 DIAGNOSIS — I10 Essential (primary) hypertension: Secondary | ICD-10-CM | POA: Diagnosis not present

## 2015-01-16 DIAGNOSIS — M797 Fibromyalgia: Secondary | ICD-10-CM | POA: Diagnosis not present

## 2015-01-16 DIAGNOSIS — E559 Vitamin D deficiency, unspecified: Secondary | ICD-10-CM | POA: Diagnosis not present

## 2015-01-16 DIAGNOSIS — E1165 Type 2 diabetes mellitus with hyperglycemia: Secondary | ICD-10-CM | POA: Diagnosis not present

## 2015-01-16 DIAGNOSIS — F411 Generalized anxiety disorder: Secondary | ICD-10-CM | POA: Diagnosis not present

## 2015-01-16 DIAGNOSIS — F329 Major depressive disorder, single episode, unspecified: Secondary | ICD-10-CM | POA: Diagnosis not present

## 2015-01-16 DIAGNOSIS — F09 Unspecified mental disorder due to known physiological condition: Secondary | ICD-10-CM | POA: Diagnosis not present

## 2015-01-21 DIAGNOSIS — M797 Fibromyalgia: Secondary | ICD-10-CM | POA: Diagnosis not present

## 2015-02-04 DIAGNOSIS — M797 Fibromyalgia: Secondary | ICD-10-CM | POA: Diagnosis not present

## 2015-02-20 DIAGNOSIS — E559 Vitamin D deficiency, unspecified: Secondary | ICD-10-CM | POA: Diagnosis not present

## 2015-02-20 DIAGNOSIS — Z1159 Encounter for screening for other viral diseases: Secondary | ICD-10-CM | POA: Diagnosis not present

## 2015-03-01 DIAGNOSIS — M797 Fibromyalgia: Secondary | ICD-10-CM | POA: Diagnosis not present

## 2015-03-27 DIAGNOSIS — E039 Hypothyroidism, unspecified: Secondary | ICD-10-CM | POA: Diagnosis not present

## 2015-03-27 DIAGNOSIS — I10 Essential (primary) hypertension: Secondary | ICD-10-CM | POA: Diagnosis not present

## 2015-03-27 DIAGNOSIS — E1165 Type 2 diabetes mellitus with hyperglycemia: Secondary | ICD-10-CM | POA: Diagnosis not present

## 2015-03-27 DIAGNOSIS — R5383 Other fatigue: Secondary | ICD-10-CM | POA: Diagnosis not present

## 2015-03-27 DIAGNOSIS — R300 Dysuria: Secondary | ICD-10-CM | POA: Diagnosis not present

## 2015-03-27 DIAGNOSIS — R109 Unspecified abdominal pain: Secondary | ICD-10-CM | POA: Diagnosis not present

## 2015-03-28 DIAGNOSIS — R300 Dysuria: Secondary | ICD-10-CM | POA: Diagnosis not present

## 2015-03-28 DIAGNOSIS — R5383 Other fatigue: Secondary | ICD-10-CM | POA: Diagnosis not present

## 2015-03-28 DIAGNOSIS — R109 Unspecified abdominal pain: Secondary | ICD-10-CM | POA: Diagnosis not present

## 2015-03-29 DIAGNOSIS — M797 Fibromyalgia: Secondary | ICD-10-CM | POA: Diagnosis not present

## 2015-04-15 DIAGNOSIS — R42 Dizziness and giddiness: Secondary | ICD-10-CM | POA: Diagnosis not present

## 2015-04-15 DIAGNOSIS — R262 Difficulty in walking, not elsewhere classified: Secondary | ICD-10-CM | POA: Diagnosis not present

## 2015-04-15 DIAGNOSIS — R293 Abnormal posture: Secondary | ICD-10-CM | POA: Diagnosis not present

## 2015-04-15 DIAGNOSIS — M797 Fibromyalgia: Secondary | ICD-10-CM | POA: Diagnosis not present

## 2015-04-17 DIAGNOSIS — R262 Difficulty in walking, not elsewhere classified: Secondary | ICD-10-CM | POA: Diagnosis not present

## 2015-04-17 DIAGNOSIS — R42 Dizziness and giddiness: Secondary | ICD-10-CM | POA: Diagnosis not present

## 2015-04-17 DIAGNOSIS — R293 Abnormal posture: Secondary | ICD-10-CM | POA: Diagnosis not present

## 2015-04-17 DIAGNOSIS — M797 Fibromyalgia: Secondary | ICD-10-CM | POA: Diagnosis not present

## 2015-04-19 DIAGNOSIS — F411 Generalized anxiety disorder: Secondary | ICD-10-CM | POA: Diagnosis not present

## 2015-04-19 DIAGNOSIS — R1013 Epigastric pain: Secondary | ICD-10-CM | POA: Diagnosis not present

## 2015-04-19 DIAGNOSIS — F09 Unspecified mental disorder due to known physiological condition: Secondary | ICD-10-CM | POA: Diagnosis not present

## 2015-04-19 DIAGNOSIS — E559 Vitamin D deficiency, unspecified: Secondary | ICD-10-CM | POA: Diagnosis not present

## 2015-04-19 DIAGNOSIS — F329 Major depressive disorder, single episode, unspecified: Secondary | ICD-10-CM | POA: Diagnosis not present

## 2015-04-19 DIAGNOSIS — I10 Essential (primary) hypertension: Secondary | ICD-10-CM | POA: Diagnosis not present

## 2015-04-19 DIAGNOSIS — E1165 Type 2 diabetes mellitus with hyperglycemia: Secondary | ICD-10-CM | POA: Diagnosis not present

## 2015-04-19 DIAGNOSIS — E538 Deficiency of other specified B group vitamins: Secondary | ICD-10-CM | POA: Diagnosis not present

## 2015-04-23 DIAGNOSIS — R42 Dizziness and giddiness: Secondary | ICD-10-CM | POA: Diagnosis not present

## 2015-04-23 DIAGNOSIS — M797 Fibromyalgia: Secondary | ICD-10-CM | POA: Diagnosis not present

## 2015-04-23 DIAGNOSIS — R293 Abnormal posture: Secondary | ICD-10-CM | POA: Diagnosis not present

## 2015-04-23 DIAGNOSIS — R262 Difficulty in walking, not elsewhere classified: Secondary | ICD-10-CM | POA: Diagnosis not present

## 2015-04-26 DIAGNOSIS — R262 Difficulty in walking, not elsewhere classified: Secondary | ICD-10-CM | POA: Diagnosis not present

## 2015-04-26 DIAGNOSIS — R293 Abnormal posture: Secondary | ICD-10-CM | POA: Diagnosis not present

## 2015-04-26 DIAGNOSIS — M797 Fibromyalgia: Secondary | ICD-10-CM | POA: Diagnosis not present

## 2015-04-26 DIAGNOSIS — R42 Dizziness and giddiness: Secondary | ICD-10-CM | POA: Diagnosis not present

## 2015-04-30 DIAGNOSIS — R109 Unspecified abdominal pain: Secondary | ICD-10-CM | POA: Diagnosis not present

## 2015-04-30 DIAGNOSIS — R160 Hepatomegaly, not elsewhere classified: Secondary | ICD-10-CM | POA: Diagnosis not present

## 2015-05-01 DIAGNOSIS — M797 Fibromyalgia: Secondary | ICD-10-CM | POA: Diagnosis not present

## 2015-05-01 DIAGNOSIS — R293 Abnormal posture: Secondary | ICD-10-CM | POA: Diagnosis not present

## 2015-05-01 DIAGNOSIS — R42 Dizziness and giddiness: Secondary | ICD-10-CM | POA: Diagnosis not present

## 2015-05-01 DIAGNOSIS — R262 Difficulty in walking, not elsewhere classified: Secondary | ICD-10-CM | POA: Diagnosis not present

## 2015-05-03 DIAGNOSIS — R42 Dizziness and giddiness: Secondary | ICD-10-CM | POA: Diagnosis not present

## 2015-05-03 DIAGNOSIS — R262 Difficulty in walking, not elsewhere classified: Secondary | ICD-10-CM | POA: Diagnosis not present

## 2015-05-03 DIAGNOSIS — R293 Abnormal posture: Secondary | ICD-10-CM | POA: Diagnosis not present

## 2015-05-03 DIAGNOSIS — M797 Fibromyalgia: Secondary | ICD-10-CM | POA: Diagnosis not present

## 2015-05-07 DIAGNOSIS — R42 Dizziness and giddiness: Secondary | ICD-10-CM | POA: Diagnosis not present

## 2015-05-07 DIAGNOSIS — M797 Fibromyalgia: Secondary | ICD-10-CM | POA: Diagnosis not present

## 2015-05-07 DIAGNOSIS — R293 Abnormal posture: Secondary | ICD-10-CM | POA: Diagnosis not present

## 2015-05-07 DIAGNOSIS — R262 Difficulty in walking, not elsewhere classified: Secondary | ICD-10-CM | POA: Diagnosis not present

## 2015-05-08 DIAGNOSIS — Z9049 Acquired absence of other specified parts of digestive tract: Secondary | ICD-10-CM | POA: Diagnosis not present

## 2015-05-08 DIAGNOSIS — K760 Fatty (change of) liver, not elsewhere classified: Secondary | ICD-10-CM | POA: Diagnosis not present

## 2015-05-09 DIAGNOSIS — R293 Abnormal posture: Secondary | ICD-10-CM | POA: Diagnosis not present

## 2015-05-09 DIAGNOSIS — M797 Fibromyalgia: Secondary | ICD-10-CM | POA: Diagnosis not present

## 2015-05-09 DIAGNOSIS — R42 Dizziness and giddiness: Secondary | ICD-10-CM | POA: Diagnosis not present

## 2015-05-09 DIAGNOSIS — R262 Difficulty in walking, not elsewhere classified: Secondary | ICD-10-CM | POA: Diagnosis not present

## 2015-05-28 DIAGNOSIS — B029 Zoster without complications: Secondary | ICD-10-CM | POA: Diagnosis not present

## 2015-06-05 DIAGNOSIS — F419 Anxiety disorder, unspecified: Secondary | ICD-10-CM | POA: Diagnosis not present

## 2015-06-05 DIAGNOSIS — B0229 Other postherpetic nervous system involvement: Secondary | ICD-10-CM | POA: Diagnosis not present

## 2015-06-25 DIAGNOSIS — B0229 Other postherpetic nervous system involvement: Secondary | ICD-10-CM | POA: Diagnosis not present

## 2015-06-25 DIAGNOSIS — R079 Chest pain, unspecified: Secondary | ICD-10-CM | POA: Diagnosis not present

## 2015-06-26 DIAGNOSIS — R079 Chest pain, unspecified: Secondary | ICD-10-CM | POA: Diagnosis not present

## 2015-07-23 DIAGNOSIS — E559 Vitamin D deficiency, unspecified: Secondary | ICD-10-CM | POA: Diagnosis not present

## 2015-07-23 DIAGNOSIS — F411 Generalized anxiety disorder: Secondary | ICD-10-CM | POA: Diagnosis not present

## 2015-07-23 DIAGNOSIS — F09 Unspecified mental disorder due to known physiological condition: Secondary | ICD-10-CM | POA: Diagnosis not present

## 2015-07-23 DIAGNOSIS — I10 Essential (primary) hypertension: Secondary | ICD-10-CM | POA: Diagnosis not present

## 2015-07-23 DIAGNOSIS — E1165 Type 2 diabetes mellitus with hyperglycemia: Secondary | ICD-10-CM | POA: Diagnosis not present

## 2015-07-29 DIAGNOSIS — R079 Chest pain, unspecified: Secondary | ICD-10-CM | POA: Diagnosis not present

## 2015-08-15 DIAGNOSIS — I8390 Asymptomatic varicose veins of unspecified lower extremity: Secondary | ICD-10-CM | POA: Diagnosis not present

## 2015-08-15 DIAGNOSIS — R748 Abnormal levels of other serum enzymes: Secondary | ICD-10-CM | POA: Diagnosis not present

## 2015-08-15 DIAGNOSIS — Z79899 Other long term (current) drug therapy: Secondary | ICD-10-CM | POA: Diagnosis not present

## 2015-08-15 DIAGNOSIS — E1165 Type 2 diabetes mellitus with hyperglycemia: Secondary | ICD-10-CM | POA: Diagnosis not present

## 2015-08-15 DIAGNOSIS — E039 Hypothyroidism, unspecified: Secondary | ICD-10-CM | POA: Diagnosis not present

## 2015-08-15 DIAGNOSIS — I10 Essential (primary) hypertension: Secondary | ICD-10-CM | POA: Diagnosis not present

## 2015-09-02 DIAGNOSIS — I8312 Varicose veins of left lower extremity with inflammation: Secondary | ICD-10-CM | POA: Diagnosis not present

## 2015-09-02 DIAGNOSIS — I8311 Varicose veins of right lower extremity with inflammation: Secondary | ICD-10-CM | POA: Diagnosis not present

## 2015-09-02 DIAGNOSIS — I83813 Varicose veins of bilateral lower extremities with pain: Secondary | ICD-10-CM | POA: Diagnosis not present

## 2015-09-02 DIAGNOSIS — I83893 Varicose veins of bilateral lower extremities with other complications: Secondary | ICD-10-CM | POA: Diagnosis not present

## 2015-09-06 DIAGNOSIS — I8311 Varicose veins of right lower extremity with inflammation: Secondary | ICD-10-CM | POA: Diagnosis not present

## 2015-09-10 DIAGNOSIS — L820 Inflamed seborrheic keratosis: Secondary | ICD-10-CM | POA: Diagnosis not present

## 2015-09-10 DIAGNOSIS — L814 Other melanin hyperpigmentation: Secondary | ICD-10-CM | POA: Diagnosis not present

## 2015-09-10 DIAGNOSIS — D485 Neoplasm of uncertain behavior of skin: Secondary | ICD-10-CM | POA: Diagnosis not present

## 2015-09-10 DIAGNOSIS — L728 Other follicular cysts of the skin and subcutaneous tissue: Secondary | ICD-10-CM | POA: Diagnosis not present

## 2015-09-19 DIAGNOSIS — I83813 Varicose veins of bilateral lower extremities with pain: Secondary | ICD-10-CM | POA: Diagnosis not present

## 2015-09-19 DIAGNOSIS — I8311 Varicose veins of right lower extremity with inflammation: Secondary | ICD-10-CM | POA: Diagnosis not present

## 2015-09-19 DIAGNOSIS — I8312 Varicose veins of left lower extremity with inflammation: Secondary | ICD-10-CM | POA: Diagnosis not present

## 2015-10-10 DIAGNOSIS — R1030 Lower abdominal pain, unspecified: Secondary | ICD-10-CM | POA: Diagnosis not present

## 2015-10-10 DIAGNOSIS — R251 Tremor, unspecified: Secondary | ICD-10-CM | POA: Diagnosis not present

## 2015-10-10 DIAGNOSIS — M25519 Pain in unspecified shoulder: Secondary | ICD-10-CM | POA: Diagnosis not present

## 2015-10-10 DIAGNOSIS — N390 Urinary tract infection, site not specified: Secondary | ICD-10-CM | POA: Diagnosis not present

## 2015-10-10 DIAGNOSIS — H6983 Other specified disorders of Eustachian tube, bilateral: Secondary | ICD-10-CM | POA: Diagnosis not present

## 2015-10-22 DIAGNOSIS — I8311 Varicose veins of right lower extremity with inflammation: Secondary | ICD-10-CM | POA: Diagnosis not present

## 2015-11-08 DIAGNOSIS — I83891 Varicose veins of right lower extremities with other complications: Secondary | ICD-10-CM | POA: Diagnosis not present

## 2015-11-08 DIAGNOSIS — I8311 Varicose veins of right lower extremity with inflammation: Secondary | ICD-10-CM | POA: Diagnosis not present

## 2015-11-11 DIAGNOSIS — I83891 Varicose veins of right lower extremities with other complications: Secondary | ICD-10-CM | POA: Diagnosis not present

## 2015-11-11 DIAGNOSIS — I8311 Varicose veins of right lower extremity with inflammation: Secondary | ICD-10-CM | POA: Diagnosis not present

## 2015-11-22 DIAGNOSIS — I8390 Asymptomatic varicose veins of unspecified lower extremity: Secondary | ICD-10-CM | POA: Diagnosis not present

## 2015-11-22 DIAGNOSIS — I10 Essential (primary) hypertension: Secondary | ICD-10-CM | POA: Diagnosis not present

## 2015-11-22 DIAGNOSIS — E538 Deficiency of other specified B group vitamins: Secondary | ICD-10-CM | POA: Diagnosis not present

## 2015-11-22 DIAGNOSIS — E559 Vitamin D deficiency, unspecified: Secondary | ICD-10-CM | POA: Diagnosis not present

## 2015-11-22 DIAGNOSIS — F411 Generalized anxiety disorder: Secondary | ICD-10-CM | POA: Diagnosis not present

## 2015-11-22 DIAGNOSIS — E1165 Type 2 diabetes mellitus with hyperglycemia: Secondary | ICD-10-CM | POA: Diagnosis not present

## 2015-11-22 DIAGNOSIS — F09 Unspecified mental disorder due to known physiological condition: Secondary | ICD-10-CM | POA: Diagnosis not present

## 2015-11-27 DIAGNOSIS — I8311 Varicose veins of right lower extremity with inflammation: Secondary | ICD-10-CM | POA: Diagnosis not present

## 2015-12-04 DIAGNOSIS — R300 Dysuria: Secondary | ICD-10-CM | POA: Diagnosis not present

## 2015-12-04 DIAGNOSIS — R109 Unspecified abdominal pain: Secondary | ICD-10-CM | POA: Diagnosis not present

## 2015-12-04 DIAGNOSIS — F419 Anxiety disorder, unspecified: Secondary | ICD-10-CM | POA: Diagnosis not present

## 2015-12-11 DIAGNOSIS — M7981 Nontraumatic hematoma of soft tissue: Secondary | ICD-10-CM | POA: Diagnosis not present

## 2015-12-11 DIAGNOSIS — I8311 Varicose veins of right lower extremity with inflammation: Secondary | ICD-10-CM | POA: Diagnosis not present

## 2015-12-20 DIAGNOSIS — E039 Hypothyroidism, unspecified: Secondary | ICD-10-CM | POA: Diagnosis not present

## 2015-12-20 DIAGNOSIS — Z23 Encounter for immunization: Secondary | ICD-10-CM | POA: Diagnosis not present

## 2015-12-20 DIAGNOSIS — Z79899 Other long term (current) drug therapy: Secondary | ICD-10-CM | POA: Diagnosis not present

## 2015-12-20 DIAGNOSIS — I10 Essential (primary) hypertension: Secondary | ICD-10-CM | POA: Diagnosis not present

## 2015-12-20 DIAGNOSIS — E559 Vitamin D deficiency, unspecified: Secondary | ICD-10-CM | POA: Diagnosis not present

## 2015-12-20 DIAGNOSIS — R3989 Other symptoms and signs involving the genitourinary system: Secondary | ICD-10-CM | POA: Diagnosis not present

## 2015-12-26 DIAGNOSIS — M7981 Nontraumatic hematoma of soft tissue: Secondary | ICD-10-CM | POA: Diagnosis not present

## 2015-12-26 DIAGNOSIS — I83891 Varicose veins of right lower extremities with other complications: Secondary | ICD-10-CM | POA: Diagnosis not present

## 2015-12-26 DIAGNOSIS — I8311 Varicose veins of right lower extremity with inflammation: Secondary | ICD-10-CM | POA: Diagnosis not present

## 2016-01-03 DIAGNOSIS — Z794 Long term (current) use of insulin: Secondary | ICD-10-CM | POA: Diagnosis not present

## 2016-01-03 DIAGNOSIS — E1165 Type 2 diabetes mellitus with hyperglycemia: Secondary | ICD-10-CM | POA: Diagnosis not present

## 2016-01-03 DIAGNOSIS — R109 Unspecified abdominal pain: Secondary | ICD-10-CM | POA: Diagnosis not present

## 2016-01-03 DIAGNOSIS — R3989 Other symptoms and signs involving the genitourinary system: Secondary | ICD-10-CM | POA: Diagnosis not present

## 2016-01-08 DIAGNOSIS — Z0000 Encounter for general adult medical examination without abnormal findings: Secondary | ICD-10-CM | POA: Diagnosis not present

## 2016-01-08 DIAGNOSIS — Z1389 Encounter for screening for other disorder: Secondary | ICD-10-CM | POA: Diagnosis not present

## 2016-01-20 DIAGNOSIS — J302 Other seasonal allergic rhinitis: Secondary | ICD-10-CM | POA: Diagnosis not present

## 2016-01-23 DIAGNOSIS — N390 Urinary tract infection, site not specified: Secondary | ICD-10-CM | POA: Diagnosis not present

## 2016-02-04 DIAGNOSIS — R3989 Other symptoms and signs involving the genitourinary system: Secondary | ICD-10-CM | POA: Diagnosis not present

## 2016-02-04 DIAGNOSIS — R109 Unspecified abdominal pain: Secondary | ICD-10-CM | POA: Diagnosis not present

## 2016-02-05 DIAGNOSIS — I8311 Varicose veins of right lower extremity with inflammation: Secondary | ICD-10-CM | POA: Diagnosis not present

## 2016-02-05 DIAGNOSIS — M7981 Nontraumatic hematoma of soft tissue: Secondary | ICD-10-CM | POA: Diagnosis not present

## 2016-02-25 DIAGNOSIS — I10 Essential (primary) hypertension: Secondary | ICD-10-CM | POA: Diagnosis not present

## 2016-02-25 DIAGNOSIS — E1165 Type 2 diabetes mellitus with hyperglycemia: Secondary | ICD-10-CM | POA: Diagnosis not present

## 2016-02-25 DIAGNOSIS — F09 Unspecified mental disorder due to known physiological condition: Secondary | ICD-10-CM | POA: Diagnosis not present

## 2016-02-25 DIAGNOSIS — E538 Deficiency of other specified B group vitamins: Secondary | ICD-10-CM | POA: Diagnosis not present

## 2016-02-25 DIAGNOSIS — E559 Vitamin D deficiency, unspecified: Secondary | ICD-10-CM | POA: Diagnosis not present

## 2016-03-03 DIAGNOSIS — I8311 Varicose veins of right lower extremity with inflammation: Secondary | ICD-10-CM | POA: Diagnosis not present

## 2016-03-03 DIAGNOSIS — M7981 Nontraumatic hematoma of soft tissue: Secondary | ICD-10-CM | POA: Diagnosis not present

## 2016-03-03 DIAGNOSIS — I83811 Varicose veins of right lower extremities with pain: Secondary | ICD-10-CM | POA: Diagnosis not present

## 2016-03-19 DIAGNOSIS — H52222 Regular astigmatism, left eye: Secondary | ICD-10-CM | POA: Diagnosis not present

## 2016-03-19 DIAGNOSIS — E119 Type 2 diabetes mellitus without complications: Secondary | ICD-10-CM | POA: Diagnosis not present

## 2016-03-19 DIAGNOSIS — H5203 Hypermetropia, bilateral: Secondary | ICD-10-CM | POA: Diagnosis not present

## 2016-03-19 DIAGNOSIS — H524 Presbyopia: Secondary | ICD-10-CM | POA: Diagnosis not present

## 2016-03-19 DIAGNOSIS — H35343 Macular cyst, hole, or pseudohole, bilateral: Secondary | ICD-10-CM | POA: Diagnosis not present

## 2016-05-14 DIAGNOSIS — Z1231 Encounter for screening mammogram for malignant neoplasm of breast: Secondary | ICD-10-CM | POA: Diagnosis not present

## 2016-05-14 DIAGNOSIS — Z853 Personal history of malignant neoplasm of breast: Secondary | ICD-10-CM | POA: Diagnosis not present

## 2016-05-28 DIAGNOSIS — Z794 Long term (current) use of insulin: Secondary | ICD-10-CM | POA: Diagnosis not present

## 2016-05-28 DIAGNOSIS — F329 Major depressive disorder, single episode, unspecified: Secondary | ICD-10-CM | POA: Diagnosis not present

## 2016-05-28 DIAGNOSIS — F411 Generalized anxiety disorder: Secondary | ICD-10-CM | POA: Diagnosis not present

## 2016-05-28 DIAGNOSIS — E1165 Type 2 diabetes mellitus with hyperglycemia: Secondary | ICD-10-CM | POA: Diagnosis not present

## 2016-05-28 DIAGNOSIS — I10 Essential (primary) hypertension: Secondary | ICD-10-CM | POA: Diagnosis not present

## 2016-05-28 DIAGNOSIS — F450 Somatization disorder: Secondary | ICD-10-CM | POA: Diagnosis not present

## 2016-05-28 DIAGNOSIS — E559 Vitamin D deficiency, unspecified: Secondary | ICD-10-CM | POA: Diagnosis not present

## 2016-06-01 DIAGNOSIS — I10 Essential (primary) hypertension: Secondary | ICD-10-CM | POA: Diagnosis not present

## 2016-06-01 DIAGNOSIS — B370 Candidal stomatitis: Secondary | ICD-10-CM | POA: Diagnosis not present

## 2016-06-01 DIAGNOSIS — E1165 Type 2 diabetes mellitus with hyperglycemia: Secondary | ICD-10-CM | POA: Diagnosis not present

## 2016-06-01 DIAGNOSIS — J309 Allergic rhinitis, unspecified: Secondary | ICD-10-CM | POA: Diagnosis not present

## 2016-06-01 DIAGNOSIS — B373 Candidiasis of vulva and vagina: Secondary | ICD-10-CM | POA: Diagnosis not present

## 2016-06-08 DIAGNOSIS — J301 Allergic rhinitis due to pollen: Secondary | ICD-10-CM | POA: Diagnosis not present

## 2016-06-09 DIAGNOSIS — B370 Candidal stomatitis: Secondary | ICD-10-CM | POA: Diagnosis not present

## 2016-06-09 DIAGNOSIS — H6502 Acute serous otitis media, left ear: Secondary | ICD-10-CM | POA: Diagnosis not present

## 2016-06-09 DIAGNOSIS — J301 Allergic rhinitis due to pollen: Secondary | ICD-10-CM | POA: Diagnosis not present

## 2016-06-11 DIAGNOSIS — H66012 Acute suppurative otitis media with spontaneous rupture of ear drum, left ear: Secondary | ICD-10-CM | POA: Diagnosis not present

## 2016-06-11 DIAGNOSIS — R040 Epistaxis: Secondary | ICD-10-CM | POA: Diagnosis not present

## 2016-06-15 DIAGNOSIS — E1165 Type 2 diabetes mellitus with hyperglycemia: Secondary | ICD-10-CM | POA: Diagnosis not present

## 2016-06-15 DIAGNOSIS — J3489 Other specified disorders of nose and nasal sinuses: Secondary | ICD-10-CM | POA: Diagnosis not present

## 2016-06-15 DIAGNOSIS — J0100 Acute maxillary sinusitis, unspecified: Secondary | ICD-10-CM | POA: Diagnosis not present

## 2016-06-15 DIAGNOSIS — R05 Cough: Secondary | ICD-10-CM | POA: Diagnosis not present

## 2016-06-15 DIAGNOSIS — H6690 Otitis media, unspecified, unspecified ear: Secondary | ICD-10-CM | POA: Diagnosis not present

## 2016-06-15 DIAGNOSIS — H6983 Other specified disorders of Eustachian tube, bilateral: Secondary | ICD-10-CM | POA: Diagnosis not present

## 2016-06-16 DIAGNOSIS — Z794 Long term (current) use of insulin: Secondary | ICD-10-CM | POA: Diagnosis not present

## 2016-06-16 DIAGNOSIS — E1165 Type 2 diabetes mellitus with hyperglycemia: Secondary | ICD-10-CM | POA: Diagnosis not present

## 2016-06-16 DIAGNOSIS — Z6826 Body mass index (BMI) 26.0-26.9, adult: Secondary | ICD-10-CM | POA: Diagnosis not present

## 2016-06-16 DIAGNOSIS — I10 Essential (primary) hypertension: Secondary | ICD-10-CM | POA: Diagnosis not present

## 2016-06-19 DIAGNOSIS — I700 Atherosclerosis of aorta: Secondary | ICD-10-CM | POA: Diagnosis not present

## 2016-06-19 DIAGNOSIS — R911 Solitary pulmonary nodule: Secondary | ICD-10-CM | POA: Diagnosis not present

## 2016-06-24 DIAGNOSIS — R911 Solitary pulmonary nodule: Secondary | ICD-10-CM | POA: Diagnosis not present

## 2016-06-24 DIAGNOSIS — M6790 Unspecified disorder of synovium and tendon, unspecified site: Secondary | ICD-10-CM | POA: Diagnosis not present

## 2016-06-24 DIAGNOSIS — H6983 Other specified disorders of Eustachian tube, bilateral: Secondary | ICD-10-CM | POA: Diagnosis not present

## 2016-06-26 DIAGNOSIS — R42 Dizziness and giddiness: Secondary | ICD-10-CM | POA: Diagnosis not present

## 2016-06-26 DIAGNOSIS — H9042 Sensorineural hearing loss, unilateral, left ear, with unrestricted hearing on the contralateral side: Secondary | ICD-10-CM | POA: Diagnosis not present

## 2016-06-26 DIAGNOSIS — H6983 Other specified disorders of Eustachian tube, bilateral: Secondary | ICD-10-CM | POA: Diagnosis not present

## 2016-07-07 DIAGNOSIS — M25511 Pain in right shoulder: Secondary | ICD-10-CM | POA: Diagnosis not present

## 2016-07-07 DIAGNOSIS — M19011 Primary osteoarthritis, right shoulder: Secondary | ICD-10-CM | POA: Diagnosis not present

## 2016-07-29 DIAGNOSIS — I8311 Varicose veins of right lower extremity with inflammation: Secondary | ICD-10-CM | POA: Diagnosis not present

## 2016-07-29 DIAGNOSIS — R911 Solitary pulmonary nodule: Secondary | ICD-10-CM | POA: Diagnosis not present

## 2016-07-29 DIAGNOSIS — I83813 Varicose veins of bilateral lower extremities with pain: Secondary | ICD-10-CM | POA: Diagnosis not present

## 2016-07-29 DIAGNOSIS — I8312 Varicose veins of left lower extremity with inflammation: Secondary | ICD-10-CM | POA: Diagnosis not present

## 2016-08-05 DIAGNOSIS — M25511 Pain in right shoulder: Secondary | ICD-10-CM | POA: Diagnosis not present

## 2016-08-05 DIAGNOSIS — M19011 Primary osteoarthritis, right shoulder: Secondary | ICD-10-CM | POA: Diagnosis not present

## 2016-08-27 DIAGNOSIS — F09 Unspecified mental disorder due to known physiological condition: Secondary | ICD-10-CM | POA: Diagnosis not present

## 2016-08-27 DIAGNOSIS — E1165 Type 2 diabetes mellitus with hyperglycemia: Secondary | ICD-10-CM | POA: Diagnosis not present

## 2016-08-27 DIAGNOSIS — E559 Vitamin D deficiency, unspecified: Secondary | ICD-10-CM | POA: Diagnosis not present

## 2016-08-27 DIAGNOSIS — I10 Essential (primary) hypertension: Secondary | ICD-10-CM | POA: Diagnosis not present

## 2016-08-27 DIAGNOSIS — F411 Generalized anxiety disorder: Secondary | ICD-10-CM | POA: Diagnosis not present

## 2016-08-27 DIAGNOSIS — Z794 Long term (current) use of insulin: Secondary | ICD-10-CM | POA: Diagnosis not present

## 2016-09-11 ENCOUNTER — Other Ambulatory Visit: Payer: Self-pay

## 2016-10-30 DIAGNOSIS — H35373 Puckering of macula, bilateral: Secondary | ICD-10-CM | POA: Diagnosis not present

## 2016-10-30 DIAGNOSIS — H35343 Macular cyst, hole, or pseudohole, bilateral: Secondary | ICD-10-CM | POA: Diagnosis not present

## 2016-10-30 DIAGNOSIS — H35353 Cystoid macular degeneration, bilateral: Secondary | ICD-10-CM | POA: Diagnosis not present

## 2016-11-02 DIAGNOSIS — M79672 Pain in left foot: Secondary | ICD-10-CM | POA: Diagnosis not present

## 2016-11-02 DIAGNOSIS — M25572 Pain in left ankle and joints of left foot: Secondary | ICD-10-CM | POA: Diagnosis not present

## 2016-11-02 DIAGNOSIS — M7732 Calcaneal spur, left foot: Secondary | ICD-10-CM | POA: Diagnosis not present

## 2016-11-09 ENCOUNTER — Ambulatory Visit (INDEPENDENT_AMBULATORY_CARE_PROVIDER_SITE_OTHER): Payer: Medicare Other | Admitting: Podiatry

## 2016-11-09 ENCOUNTER — Encounter (INDEPENDENT_AMBULATORY_CARE_PROVIDER_SITE_OTHER): Payer: Self-pay

## 2016-11-09 ENCOUNTER — Encounter: Payer: Self-pay | Admitting: Podiatry

## 2016-11-09 VITALS — BP 154/86 | HR 99

## 2016-11-09 DIAGNOSIS — M722 Plantar fascial fibromatosis: Secondary | ICD-10-CM

## 2016-11-09 DIAGNOSIS — M659 Synovitis and tenosynovitis, unspecified: Secondary | ICD-10-CM

## 2016-11-09 NOTE — Patient Instructions (Signed)

## 2016-11-09 NOTE — Progress Notes (Addendum)
Subjective:  Patient ID: Danielle Mccoy, female    DOB: [birth year],  MRN: [id number] HPI Chief Complaint  Patient presents with  . Foot Pain    left heel pain for about a week   using a brace foot cream and brace she purchased over the counter     71 y.o. female presents with the above complaint. Reports pain in bottom and back of L heel. Sharp in nature. Has tried OTC brace which she states helps somewhat. Denies recent escalation of activity.  Reports DM, insulin controlled. Would like to discuss diabetic shoes.  Past Medical History:  Diagnosis Date  . Anxiety   . Cancer ([place])    breast  . Cervicalgia   . Diabetes mellitus   . Fibromyalgia   . GERD (gastroesophageal reflux disease)   . History of pneumonia   . Hypertension   . IBS (irritable bowel syndrome)   . Pancreatitis   . Panic attacks   . Varicose veins    Past Surgical History:  Procedure Laterality Date  . ABDOMINAL HYSTERECTOMY  1988  . APPENDECTOMY    . blood clot removal  1990   right arm  . BREAST LUMPECTOMY  2011  . CHOLECYSTECTOMY  2008   Gall bladder  . MASTECTOMY  1989  . NERVE TRANSFER FOR MUSCLE REPAIR, FOREARM     left arm  . SHOULDER SURGERY  1997  . SPINE SURGERY     nerve cyst removal   . TONSILLECTOMY AND ADENOIDECTOMY  1974  . TUBAL LIGATION  1977    Current Outpatient Prescriptions:  .  fluticasone (FLONASE) 50 MCG/ACT nasal spray, Place 2 sprays into the nose daily as needed.  , Disp: , Rfl:  .  insulin NPH Human (HUMULIN N,NOVOLIN N) 100 UNIT/ML injection, Inject into the skin., Disp: , Rfl:  .  insulin regular (NOVOLIN R,HUMULIN R) 100 units/mL injection, Inject into the skin., Disp: , Rfl:  .  KRILL OIL PO, Take by mouth.  , Disp: , Rfl:  .  LORazepam (ATIVAN) 0.5 MG tablet, Take 0.5 mg by mouth every 8 (eight) hours.  , Disp: , Rfl:  .  losartan-hydrochlorothiazide (HYZAAR) 100-12.5 MG per tablet, Take 1 tablet by mouth daily.  , Disp: , Rfl:  .  Milk Thistle 250 MG CAPS, Take  250 mg by mouth daily.  , Disp: , Rfl:  .  Multiple Vitamins-Minerals (EYE-VITE EXTRA) CAPS, Take by mouth.  , Disp: , Rfl:  .  pantoprazole (PROTONIX) 40 MG tablet, Take 40 mg by mouth daily.  , Disp: , Rfl:  .  prochlorperazine (COMPAZINE) 5 MG tablet, Take 5 mg by mouth every 6 (six) hours as needed.  , Disp: , Rfl:  .  venlafaxine (EFFEXOR) 25 MG tablet, Take by mouth. 1/4 tablet once daily , Disp: , Rfl:  .  gabapentin (NEURONTIN) 100 MG capsule, Take 50 mg by mouth 2 (two) times daily.  , Disp: , Rfl:  .  glipiZIDE (GLUCOTROL XL) 2.5 MG 24 hr tablet, Take 2.5 mg by mouth 3 (three) times daily as needed.  , Disp: , Rfl:   Allergies  Allergen Reactions  . Aspirin     Causes gastric problems   . Cholesterol   . Divalproex Sodium     Causes numbness  . Erythromycin     Causes severe headache  . Inapsine [Droperidol]   . Methylprednisolone     Causes sweating  . Metronidazole     Causes severe headaches   .  Morphine Sulfate Nausea And Vomiting  . Nortriptyline Hcl     Causes blurred vision  . Nutritional Supplements     Causes gastric problems  . Omnipaque [Iohexol]     Causes patient to become non-responsive  . Smz-Tmp Ds [Sulfamethoxazole W/Trimethoprim (Co-Trimoxazole)]     Causes sore throat  . Sulfa Antibiotics     Causes severe headaches  . Valium Nausea And Vomiting   Review of Systems negative except as noted in history of present illness Objective:   Vitals:   11/09/16 1113  BP: (!) 154/86  Pulse: 99   General AA&O x3. Normal mood and affect.  Vascular Dorsalis pedis and posterior tibial pulses  present 2+ bilaterally  Capillary refill normal to all digits. Pedal hair growth normal.  Neurologic Epicritic sensation grossly diminished bilaterally. Protective sensation absent.  Dermatologic No open lesions. Interspaces clear of maceration. Nails well groomed and normal in appearance.  Orthopedic: MMT 5/5 in dorsiflexion, plantarflexion, inversion, and  eversion bilaterally. Tender to palpation at the calcaneal tuber left. No pain with calcaneal squeeze left. Ankle ROM diminished range of motion left. Silfverskiold Test: positive left. Pes planus bilat. Plantarflexed metatarsals bilat.   Assessment & Plan:  Patient was evaluated and treated and all questions answered.  Plantar fascitis left -Educated on etiology -Discussed benefits of injection therapy. Patient initially verbalized reluctance to receive an injection due to supposed allergy to steroids however patient has received injections in her joints before without issue. -Injection delivered as below -Night splint dispensed. Medically necessary for assistance and stretching of the plantar fascia  Procedure: Injection Tendon/Ligament Location: Left plantar fascia at the glabrous junction; medial approach. Skin Prep: Alcohol. Injectate: 1 cc 0.5% marcaine plain, 1 cc dexamethasone phosphate, 0.5 cc kenalog 10. Disposition: Patient tolerated procedure well. Injection site dressed with a band-aid.  DM with DPN -Rx for DM shoes written. Patient to f/u for pickup.  30 minutes of face to face time were spent with the patient. >50% of this was spent on counseling and coordination of care of the above diagnoses.   Return in about 6 weeks (around 12/21/2016).

## 2016-11-14 MED ORDER — TRIAMCINOLONE ACETONIDE 10 MG/ML IJ SUSP
5.0000 mg | Freq: Once | INTRAMUSCULAR | Status: AC
  Administered 2016-11-09: 5 mg

## 2016-11-14 MED ORDER — DEXAMETHASONE SODIUM PHOSPHATE 120 MG/30ML IJ SOLN
4.0000 mg | Freq: Once | INTRAMUSCULAR | Status: AC
  Administered 2016-11-09: 4 mg via INTRA_ARTICULAR

## 2016-12-01 DIAGNOSIS — F419 Anxiety disorder, unspecified: Secondary | ICD-10-CM | POA: Diagnosis not present

## 2016-12-01 DIAGNOSIS — M797 Fibromyalgia: Secondary | ICD-10-CM | POA: Diagnosis not present

## 2016-12-01 DIAGNOSIS — R42 Dizziness and giddiness: Secondary | ICD-10-CM | POA: Diagnosis not present

## 2016-12-01 DIAGNOSIS — E039 Hypothyroidism, unspecified: Secondary | ICD-10-CM | POA: Diagnosis not present

## 2016-12-01 DIAGNOSIS — I10 Essential (primary) hypertension: Secondary | ICD-10-CM | POA: Diagnosis not present

## 2016-12-01 DIAGNOSIS — M25572 Pain in left ankle and joints of left foot: Secondary | ICD-10-CM | POA: Diagnosis not present

## 2016-12-01 DIAGNOSIS — E1165 Type 2 diabetes mellitus with hyperglycemia: Secondary | ICD-10-CM | POA: Diagnosis not present

## 2016-12-03 ENCOUNTER — Other Ambulatory Visit: Payer: Medicare Other

## 2016-12-14 DIAGNOSIS — L821 Other seborrheic keratosis: Secondary | ICD-10-CM | POA: Diagnosis not present

## 2016-12-14 DIAGNOSIS — L578 Other skin changes due to chronic exposure to nonionizing radiation: Secondary | ICD-10-CM | POA: Diagnosis not present

## 2016-12-14 DIAGNOSIS — D485 Neoplasm of uncertain behavior of skin: Secondary | ICD-10-CM | POA: Diagnosis not present

## 2016-12-15 ENCOUNTER — Ambulatory Visit (INDEPENDENT_AMBULATORY_CARE_PROVIDER_SITE_OTHER): Payer: Medicare Other | Admitting: Podiatry

## 2016-12-15 DIAGNOSIS — M9262 Juvenile osteochondrosis of tarsus, left ankle: Secondary | ICD-10-CM

## 2016-12-15 DIAGNOSIS — M659 Synovitis and tenosynovitis, unspecified: Secondary | ICD-10-CM

## 2016-12-15 DIAGNOSIS — M722 Plantar fascial fibromatosis: Secondary | ICD-10-CM

## 2016-12-15 DIAGNOSIS — M7662 Achilles tendinitis, left leg: Secondary | ICD-10-CM | POA: Diagnosis not present

## 2016-12-15 MED ORDER — NONFORMULARY OR COMPOUNDED ITEM: 1.0000 g | Freq: Four times a day (QID) | 2 refills | Status: DC | PRN

## 2016-12-15 NOTE — Patient Instructions (Signed)
.  sher

## 2016-12-15 NOTE — Progress Notes (Signed)
  Subjective:  Patient ID: Danielle Mccoy, female    DOB: [birth year],  MRN: [id number]  Chief Complaint  Patient presents with  . Plantar Fasciitis    6 wk fu left foot shot was no help but exercises have helped a lot    71 y.o. female returns for the above complaint. States that the exercises helped her. However she had no relief from the injection. Denies new issues.   Objective:  There were no vitals filed for this visit. General AA&O x3. Normal mood and affect.  Vascular Foot warm and well perfused.  Neurologic Epicritic sensation grossly intact.  Dermatologic No open lesions. Skin normal texture and turgor.  Orthopedic: Pain to palpation L plantar and posterior calcaneus. L ankle ROM to 0      Assessment & Plan:  Patient was evaluated and treated and all questions answered.  Plantar Fasciitis/Achilles Tendonitis -Offered repeat injection. Declined. -Offered PT referral. Patient wishes to defer. -Rx compound pain cream.  Return in about 3 weeks (around 01/05/2017) for Plantar fasciitis.

## 2016-12-21 ENCOUNTER — Ambulatory Visit: Payer: Medicare Other | Admitting: Podiatry

## 2016-12-29 DIAGNOSIS — R809 Proteinuria, unspecified: Secondary | ICD-10-CM | POA: Diagnosis not present

## 2016-12-29 DIAGNOSIS — E1165 Type 2 diabetes mellitus with hyperglycemia: Secondary | ICD-10-CM | POA: Diagnosis not present

## 2016-12-29 DIAGNOSIS — Z794 Long term (current) use of insulin: Secondary | ICD-10-CM | POA: Diagnosis not present

## 2016-12-29 DIAGNOSIS — E559 Vitamin D deficiency, unspecified: Secondary | ICD-10-CM | POA: Diagnosis not present

## 2016-12-29 DIAGNOSIS — E1129 Type 2 diabetes mellitus with other diabetic kidney complication: Secondary | ICD-10-CM | POA: Diagnosis not present

## 2017-01-05 ENCOUNTER — Ambulatory Visit (INDEPENDENT_AMBULATORY_CARE_PROVIDER_SITE_OTHER): Payer: Medicare Other | Admitting: Podiatry

## 2017-01-05 DIAGNOSIS — M659 Synovitis and tenosynovitis, unspecified: Secondary | ICD-10-CM

## 2017-01-05 DIAGNOSIS — M7662 Achilles tendinitis, left leg: Secondary | ICD-10-CM | POA: Diagnosis not present

## 2017-01-05 DIAGNOSIS — M722 Plantar fascial fibromatosis: Secondary | ICD-10-CM

## 2017-01-10 ENCOUNTER — Encounter: Payer: Self-pay | Admitting: Podiatry

## 2017-01-10 NOTE — Progress Notes (Signed)
  Subjective:  Patient ID: Danielle Mccoy, female    DOB: [birth year],  MRN: [id number]  Chief Complaint  Patient presents with  . Diabetes    PCP has not signed DS pw giving copies to pt to take herself   . Foot Pain    pain has signifgantly decreased    71 y.o. female returns for the above complaint.  Says that her primary care doctor has not signed a form for diabetic shoes.  Reports that her pain in her left foot is almost completely resolved.  Only 1 out of 10 today  Objective:  There were no vitals filed for this visit. General AA&O x3. Normal mood and affect.  Vascular Pedal pulses palpable.  Neurologic Epicritic sensation grossly intact.  Dermatologic No open lesions. Skin normal texture and turgor.  Orthopedic: No pain to palpation either foot.   Assessment & Plan:  Patient was evaluated and treated and all questions answered.  Plantar Fasciitis, L -Essentially resolved. -Educated on importance of continued stretches -Advised on return precautions  Return in about 2 months (around 03/07/2017) for Tendonitis, Diabetic Foot Care.

## 2017-01-12 DIAGNOSIS — H9202 Otalgia, left ear: Secondary | ICD-10-CM | POA: Diagnosis not present

## 2017-01-12 DIAGNOSIS — R42 Dizziness and giddiness: Secondary | ICD-10-CM | POA: Diagnosis not present

## 2017-01-14 DIAGNOSIS — D485 Neoplasm of uncertain behavior of skin: Secondary | ICD-10-CM | POA: Diagnosis not present

## 2017-01-27 DIAGNOSIS — K5900 Constipation, unspecified: Secondary | ICD-10-CM | POA: Diagnosis not present

## 2017-01-27 DIAGNOSIS — H6982 Other specified disorders of Eustachian tube, left ear: Secondary | ICD-10-CM | POA: Diagnosis not present

## 2017-01-27 DIAGNOSIS — K219 Gastro-esophageal reflux disease without esophagitis: Secondary | ICD-10-CM | POA: Diagnosis not present

## 2017-01-27 DIAGNOSIS — E559 Vitamin D deficiency, unspecified: Secondary | ICD-10-CM | POA: Diagnosis not present

## 2017-01-27 DIAGNOSIS — Z1389 Encounter for screening for other disorder: Secondary | ICD-10-CM | POA: Diagnosis not present

## 2017-01-27 DIAGNOSIS — Z79899 Other long term (current) drug therapy: Secondary | ICD-10-CM | POA: Diagnosis not present

## 2017-01-27 DIAGNOSIS — E1165 Type 2 diabetes mellitus with hyperglycemia: Secondary | ICD-10-CM | POA: Diagnosis not present

## 2017-01-27 DIAGNOSIS — E039 Hypothyroidism, unspecified: Secondary | ICD-10-CM | POA: Diagnosis not present

## 2017-01-27 DIAGNOSIS — Z0001 Encounter for general adult medical examination with abnormal findings: Secondary | ICD-10-CM | POA: Diagnosis not present

## 2017-01-27 DIAGNOSIS — I10 Essential (primary) hypertension: Secondary | ICD-10-CM | POA: Diagnosis not present

## 2017-02-05 DIAGNOSIS — I700 Atherosclerosis of aorta: Secondary | ICD-10-CM | POA: Diagnosis not present

## 2017-02-05 DIAGNOSIS — R911 Solitary pulmonary nodule: Secondary | ICD-10-CM | POA: Diagnosis not present

## 2017-02-15 DIAGNOSIS — M79661 Pain in right lower leg: Secondary | ICD-10-CM | POA: Diagnosis not present

## 2017-02-15 DIAGNOSIS — J0100 Acute maxillary sinusitis, unspecified: Secondary | ICD-10-CM | POA: Diagnosis not present

## 2017-02-15 DIAGNOSIS — H9201 Otalgia, right ear: Secondary | ICD-10-CM | POA: Diagnosis not present

## 2017-02-17 DIAGNOSIS — M79661 Pain in right lower leg: Secondary | ICD-10-CM | POA: Diagnosis not present

## 2017-02-17 DIAGNOSIS — M79604 Pain in right leg: Secondary | ICD-10-CM | POA: Diagnosis not present

## 2017-02-18 DIAGNOSIS — N3090 Cystitis, unspecified without hematuria: Secondary | ICD-10-CM | POA: Diagnosis not present

## 2017-02-18 DIAGNOSIS — N3001 Acute cystitis with hematuria: Secondary | ICD-10-CM | POA: Diagnosis not present

## 2017-03-03 DIAGNOSIS — M79604 Pain in right leg: Secondary | ICD-10-CM | POA: Diagnosis not present

## 2017-03-03 DIAGNOSIS — M7918 Myalgia, other site: Secondary | ICD-10-CM | POA: Diagnosis not present

## 2017-03-05 DIAGNOSIS — M79604 Pain in right leg: Secondary | ICD-10-CM | POA: Diagnosis not present

## 2017-03-05 DIAGNOSIS — M545 Low back pain: Secondary | ICD-10-CM | POA: Diagnosis not present

## 2017-03-08 ENCOUNTER — Ambulatory Visit (INDEPENDENT_AMBULATORY_CARE_PROVIDER_SITE_OTHER): Payer: Medicare Other | Admitting: Podiatry

## 2017-03-08 DIAGNOSIS — M2040 Other hammer toe(s) (acquired), unspecified foot: Secondary | ICD-10-CM | POA: Diagnosis not present

## 2017-03-08 DIAGNOSIS — M722 Plantar fascial fibromatosis: Secondary | ICD-10-CM

## 2017-03-08 DIAGNOSIS — M2010 Hallux valgus (acquired), unspecified foot: Secondary | ICD-10-CM

## 2017-03-08 DIAGNOSIS — M9262 Juvenile osteochondrosis of tarsus, left ankle: Secondary | ICD-10-CM

## 2017-03-08 DIAGNOSIS — E1140 Type 2 diabetes mellitus with diabetic neuropathy, unspecified: Secondary | ICD-10-CM

## 2017-03-08 NOTE — Patient Instructions (Signed)

## 2017-03-09 DIAGNOSIS — M48061 Spinal stenosis, lumbar region without neurogenic claudication: Secondary | ICD-10-CM | POA: Diagnosis not present

## 2017-03-09 DIAGNOSIS — M5127 Other intervertebral disc displacement, lumbosacral region: Secondary | ICD-10-CM | POA: Diagnosis not present

## 2017-03-09 DIAGNOSIS — M79604 Pain in right leg: Secondary | ICD-10-CM | POA: Diagnosis not present

## 2017-03-09 DIAGNOSIS — M549 Dorsalgia, unspecified: Secondary | ICD-10-CM | POA: Diagnosis not present

## 2017-03-09 DIAGNOSIS — M7918 Myalgia, other site: Secondary | ICD-10-CM | POA: Diagnosis not present

## 2017-03-09 DIAGNOSIS — M545 Low back pain: Secondary | ICD-10-CM | POA: Diagnosis not present

## 2017-03-18 NOTE — Progress Notes (Signed)
  Subjective:  Patient ID: Danielle Mccoy, female    DOB: February 08, 1946,  MRN: [id number]  No chief complaint on file.  72 y.o. female returns for the above complaint.  States her plantar fasciitis is a lot better.  Also here for pickup of her diabetic shoes.  Reports that she is having issues with her sciatica.  Objective:  There were no vitals filed for this visit. General AA&O x3. Normal mood and affect.  Vascular Pedal pulses palpable.  Neurologic Epicritic sensation grossly intact.  Dermatologic No open lesions. Skin normal texture and turgor.  Orthopedic: No pain to palpation either foot.   Assessment & Plan:  Patient was evaluated and treated and all questions answered.  Plantar fasciitis -No complaints today  Diabetes mellitus -Diabetic shoes dispensed.  Educated on proper wearing.  Return in about 2 months (around 05/06/2017) for DM Foot Check, f/u shoes.

## 2017-03-24 DIAGNOSIS — E039 Hypothyroidism, unspecified: Secondary | ICD-10-CM | POA: Diagnosis not present

## 2017-03-26 DIAGNOSIS — M5417 Radiculopathy, lumbosacral region: Secondary | ICD-10-CM | POA: Diagnosis not present

## 2017-03-26 DIAGNOSIS — M5127 Other intervertebral disc displacement, lumbosacral region: Secondary | ICD-10-CM | POA: Diagnosis not present

## 2017-04-01 DIAGNOSIS — Z794 Long term (current) use of insulin: Secondary | ICD-10-CM | POA: Diagnosis not present

## 2017-04-01 DIAGNOSIS — R809 Proteinuria, unspecified: Secondary | ICD-10-CM | POA: Diagnosis not present

## 2017-04-01 DIAGNOSIS — E1129 Type 2 diabetes mellitus with other diabetic kidney complication: Secondary | ICD-10-CM | POA: Diagnosis not present

## 2017-04-01 DIAGNOSIS — E1165 Type 2 diabetes mellitus with hyperglycemia: Secondary | ICD-10-CM | POA: Diagnosis not present

## 2017-04-01 DIAGNOSIS — M5126 Other intervertebral disc displacement, lumbar region: Secondary | ICD-10-CM | POA: Diagnosis not present

## 2017-04-06 DIAGNOSIS — K581 Irritable bowel syndrome with constipation: Secondary | ICD-10-CM | POA: Diagnosis not present

## 2017-04-29 DIAGNOSIS — H52222 Regular astigmatism, left eye: Secondary | ICD-10-CM | POA: Diagnosis not present

## 2017-04-29 DIAGNOSIS — Z7984 Long term (current) use of oral hypoglycemic drugs: Secondary | ICD-10-CM | POA: Diagnosis not present

## 2017-04-29 DIAGNOSIS — H35353 Cystoid macular degeneration, bilateral: Secondary | ICD-10-CM | POA: Diagnosis not present

## 2017-04-29 DIAGNOSIS — H25813 Combined forms of age-related cataract, bilateral: Secondary | ICD-10-CM | POA: Diagnosis not present

## 2017-04-29 DIAGNOSIS — H5203 Hypermetropia, bilateral: Secondary | ICD-10-CM | POA: Diagnosis not present

## 2017-04-29 DIAGNOSIS — H524 Presbyopia: Secondary | ICD-10-CM | POA: Diagnosis not present

## 2017-04-29 DIAGNOSIS — I10 Essential (primary) hypertension: Secondary | ICD-10-CM | POA: Diagnosis not present

## 2017-04-29 DIAGNOSIS — E119 Type 2 diabetes mellitus without complications: Secondary | ICD-10-CM | POA: Diagnosis not present

## 2017-04-29 DIAGNOSIS — H35373 Puckering of macula, bilateral: Secondary | ICD-10-CM | POA: Diagnosis not present

## 2017-05-03 DIAGNOSIS — D485 Neoplasm of uncertain behavior of skin: Secondary | ICD-10-CM | POA: Diagnosis not present

## 2017-05-03 DIAGNOSIS — L299 Pruritus, unspecified: Secondary | ICD-10-CM | POA: Diagnosis not present

## 2017-05-03 DIAGNOSIS — L570 Actinic keratosis: Secondary | ICD-10-CM | POA: Diagnosis not present

## 2017-05-03 DIAGNOSIS — L300 Nummular dermatitis: Secondary | ICD-10-CM | POA: Diagnosis not present

## 2017-05-10 ENCOUNTER — Ambulatory Visit (INDEPENDENT_AMBULATORY_CARE_PROVIDER_SITE_OTHER): Payer: Medicare Other | Admitting: Podiatry

## 2017-05-10 DIAGNOSIS — E1140 Type 2 diabetes mellitus with diabetic neuropathy, unspecified: Secondary | ICD-10-CM

## 2017-05-10 DIAGNOSIS — B351 Tinea unguium: Secondary | ICD-10-CM | POA: Diagnosis not present

## 2017-05-10 DIAGNOSIS — M5431 Sciatica, right side: Secondary | ICD-10-CM

## 2017-05-10 DIAGNOSIS — H6983 Other specified disorders of Eustachian tube, bilateral: Secondary | ICD-10-CM | POA: Diagnosis not present

## 2017-05-10 DIAGNOSIS — M5126 Other intervertebral disc displacement, lumbar region: Secondary | ICD-10-CM | POA: Diagnosis not present

## 2017-05-10 DIAGNOSIS — N816 Rectocele: Secondary | ICD-10-CM | POA: Diagnosis not present

## 2017-05-10 NOTE — Progress Notes (Signed)
  Subjective:  Patient ID: Danielle Mccoy Tuman, female    DOB: 05-24-1945,  MRN: [id number]  Chief Complaint  Patient presents with  . diabetic shoes    F/U Diabetic shoes PT. stated," I want to return them, the shoes were rubbing/irritating my grea toe joint."   72 y.o. female returns for the above complaint. States that her DM shoes are irritating her and would like them adjusted. Has had worsening sciatica to the R side for which she will be needing surgery. Pain in the heels essentially resolved.  Objective:   General AA&O x3. Normal mood and affect.  Vascular Dorsalis pedis and posterior tibial pulses  present 2+ bilaterally  Capillary refill normal to all digits. Pedal hair growth normal.  Neurologic Epicritic sensation grossly diminished bilaterally. Protective sensation absent. Tingling and paresthesias sciatic nerve distribution with R straight leg raise.  Dermatologic No open lesions. Interspaces clear of maceration. Nails elongated, slightly thickened,  Orthopedic: MMT 5/5 in dorsiflexion, plantarflexion, inversion, and eversion bilaterally. Hammertoes bilat. Slight TTP bilat medial calc tubers.    Assessment & Plan:  Patient was evaluated and treated and all questions answered.  Sciatica R -Patient will be f/u with a surgeon for spinal issues. -Should symptoms persist would consider neuropathic pain cream/  Diabetes with DPN, Onychomycosis -Educated on diabetic footcare. Diabetic risk level 1 -Nails x10 debrided sharply and manually with large nail nipper and rotary burr.   Procedure: Nail Debridement Rationale: Patient meets criteria for routine foot care due to 1 Class B, 2 class C findings. Type of Debridement: manual, sharp debridement. Instrumentation: Nail nipper, rotary burr. Number of Nails: 10    No follow-ups on file.

## 2017-05-20 ENCOUNTER — Ambulatory Visit: Payer: Medicare Other | Admitting: *Deleted

## 2017-05-20 DIAGNOSIS — E1140 Type 2 diabetes mellitus with diabetic neuropathy, unspecified: Secondary | ICD-10-CM

## 2017-05-27 DIAGNOSIS — N390 Urinary tract infection, site not specified: Secondary | ICD-10-CM | POA: Diagnosis not present

## 2017-06-03 DIAGNOSIS — M5117 Intervertebral disc disorders with radiculopathy, lumbosacral region: Secondary | ICD-10-CM | POA: Diagnosis not present

## 2017-06-03 DIAGNOSIS — M5127 Other intervertebral disc displacement, lumbosacral region: Secondary | ICD-10-CM | POA: Diagnosis not present

## 2017-06-03 DIAGNOSIS — M5417 Radiculopathy, lumbosacral region: Secondary | ICD-10-CM | POA: Diagnosis not present

## 2017-07-06 DIAGNOSIS — E559 Vitamin D deficiency, unspecified: Secondary | ICD-10-CM | POA: Diagnosis not present

## 2017-07-06 DIAGNOSIS — E1165 Type 2 diabetes mellitus with hyperglycemia: Secondary | ICD-10-CM | POA: Diagnosis not present

## 2017-07-06 DIAGNOSIS — Z794 Long term (current) use of insulin: Secondary | ICD-10-CM | POA: Diagnosis not present

## 2017-07-06 DIAGNOSIS — I10 Essential (primary) hypertension: Secondary | ICD-10-CM | POA: Diagnosis not present

## 2017-07-06 DIAGNOSIS — F329 Major depressive disorder, single episode, unspecified: Secondary | ICD-10-CM | POA: Diagnosis not present

## 2017-07-08 DIAGNOSIS — R140 Abdominal distension (gaseous): Secondary | ICD-10-CM | POA: Diagnosis not present

## 2017-07-08 DIAGNOSIS — M545 Low back pain: Secondary | ICD-10-CM | POA: Diagnosis not present

## 2017-07-08 DIAGNOSIS — R110 Nausea: Secondary | ICD-10-CM | POA: Diagnosis not present

## 2017-07-08 DIAGNOSIS — R1011 Right upper quadrant pain: Secondary | ICD-10-CM | POA: Diagnosis not present

## 2017-07-08 DIAGNOSIS — R911 Solitary pulmonary nodule: Secondary | ICD-10-CM | POA: Diagnosis not present

## 2017-07-08 DIAGNOSIS — G8929 Other chronic pain: Secondary | ICD-10-CM | POA: Diagnosis not present

## 2017-07-09 DIAGNOSIS — R918 Other nonspecific abnormal finding of lung field: Secondary | ICD-10-CM | POA: Diagnosis not present

## 2017-07-09 DIAGNOSIS — R1011 Right upper quadrant pain: Secondary | ICD-10-CM | POA: Diagnosis not present

## 2017-07-09 DIAGNOSIS — K7689 Other specified diseases of liver: Secondary | ICD-10-CM | POA: Diagnosis not present

## 2017-07-09 DIAGNOSIS — R911 Solitary pulmonary nodule: Secondary | ICD-10-CM | POA: Diagnosis not present

## 2017-07-09 DIAGNOSIS — R140 Abdominal distension (gaseous): Secondary | ICD-10-CM | POA: Diagnosis not present

## 2017-07-28 DIAGNOSIS — R1011 Right upper quadrant pain: Secondary | ICD-10-CM | POA: Diagnosis not present

## 2017-08-10 DIAGNOSIS — R1011 Right upper quadrant pain: Secondary | ICD-10-CM | POA: Diagnosis not present

## 2017-08-17 DIAGNOSIS — R1011 Right upper quadrant pain: Secondary | ICD-10-CM | POA: Diagnosis not present

## 2017-08-31 DIAGNOSIS — R1011 Right upper quadrant pain: Secondary | ICD-10-CM | POA: Diagnosis not present

## 2017-09-07 DIAGNOSIS — R1011 Right upper quadrant pain: Secondary | ICD-10-CM | POA: Diagnosis not present

## 2017-09-09 DIAGNOSIS — E039 Hypothyroidism, unspecified: Secondary | ICD-10-CM | POA: Diagnosis not present

## 2017-09-09 DIAGNOSIS — I10 Essential (primary) hypertension: Secondary | ICD-10-CM | POA: Diagnosis not present

## 2017-09-09 DIAGNOSIS — B370 Candidal stomatitis: Secondary | ICD-10-CM | POA: Diagnosis not present

## 2017-09-09 DIAGNOSIS — E1165 Type 2 diabetes mellitus with hyperglycemia: Secondary | ICD-10-CM | POA: Diagnosis not present

## 2017-09-09 DIAGNOSIS — M797 Fibromyalgia: Secondary | ICD-10-CM | POA: Diagnosis not present

## 2017-09-21 DIAGNOSIS — R1011 Right upper quadrant pain: Secondary | ICD-10-CM | POA: Diagnosis not present

## 2017-09-28 DIAGNOSIS — R1011 Right upper quadrant pain: Secondary | ICD-10-CM | POA: Diagnosis not present

## 2017-10-05 DIAGNOSIS — R1011 Right upper quadrant pain: Secondary | ICD-10-CM | POA: Diagnosis not present

## 2017-10-06 DIAGNOSIS — R200 Anesthesia of skin: Secondary | ICD-10-CM | POA: Diagnosis not present

## 2017-10-06 DIAGNOSIS — E1165 Type 2 diabetes mellitus with hyperglycemia: Secondary | ICD-10-CM | POA: Diagnosis not present

## 2017-10-06 DIAGNOSIS — M79671 Pain in right foot: Secondary | ICD-10-CM | POA: Diagnosis not present

## 2017-10-06 DIAGNOSIS — M79672 Pain in left foot: Secondary | ICD-10-CM | POA: Diagnosis not present

## 2017-10-06 DIAGNOSIS — Z794 Long term (current) use of insulin: Secondary | ICD-10-CM | POA: Diagnosis not present

## 2017-10-27 DIAGNOSIS — E1165 Type 2 diabetes mellitus with hyperglycemia: Secondary | ICD-10-CM | POA: Diagnosis not present

## 2017-10-27 DIAGNOSIS — R8290 Unspecified abnormal findings in urine: Secondary | ICD-10-CM | POA: Diagnosis not present

## 2017-10-27 DIAGNOSIS — N816 Rectocele: Secondary | ICD-10-CM | POA: Diagnosis not present

## 2017-10-27 DIAGNOSIS — K5900 Constipation, unspecified: Secondary | ICD-10-CM | POA: Diagnosis not present

## 2017-10-27 DIAGNOSIS — R82998 Other abnormal findings in urine: Secondary | ICD-10-CM | POA: Diagnosis not present

## 2017-10-27 DIAGNOSIS — K581 Irritable bowel syndrome with constipation: Secondary | ICD-10-CM | POA: Diagnosis not present

## 2017-10-27 DIAGNOSIS — R351 Nocturia: Secondary | ICD-10-CM | POA: Diagnosis not present

## 2017-10-27 DIAGNOSIS — M797 Fibromyalgia: Secondary | ICD-10-CM | POA: Diagnosis not present

## 2017-10-27 DIAGNOSIS — N952 Postmenopausal atrophic vaginitis: Secondary | ICD-10-CM | POA: Diagnosis not present

## 2017-11-01 DIAGNOSIS — H35373 Puckering of macula, bilateral: Secondary | ICD-10-CM | POA: Diagnosis not present

## 2017-11-01 DIAGNOSIS — H35353 Cystoid macular degeneration, bilateral: Secondary | ICD-10-CM | POA: Diagnosis not present

## 2017-11-23 DIAGNOSIS — K5909 Other constipation: Secondary | ICD-10-CM | POA: Diagnosis not present

## 2017-11-23 DIAGNOSIS — E039 Hypothyroidism, unspecified: Secondary | ICD-10-CM | POA: Diagnosis not present

## 2017-11-23 DIAGNOSIS — R1011 Right upper quadrant pain: Secondary | ICD-10-CM | POA: Diagnosis not present

## 2017-11-23 DIAGNOSIS — N390 Urinary tract infection, site not specified: Secondary | ICD-10-CM | POA: Diagnosis not present

## 2017-11-23 DIAGNOSIS — Z79899 Other long term (current) drug therapy: Secondary | ICD-10-CM | POA: Diagnosis not present

## 2017-11-23 DIAGNOSIS — R911 Solitary pulmonary nodule: Secondary | ICD-10-CM | POA: Diagnosis not present

## 2017-11-23 DIAGNOSIS — I10 Essential (primary) hypertension: Secondary | ICD-10-CM | POA: Diagnosis not present

## 2017-11-23 DIAGNOSIS — K219 Gastro-esophageal reflux disease without esophagitis: Secondary | ICD-10-CM | POA: Diagnosis not present

## 2017-11-23 DIAGNOSIS — L989 Disorder of the skin and subcutaneous tissue, unspecified: Secondary | ICD-10-CM | POA: Diagnosis not present

## 2017-12-03 DIAGNOSIS — M5127 Other intervertebral disc displacement, lumbosacral region: Secondary | ICD-10-CM | POA: Diagnosis not present

## 2017-12-14 DIAGNOSIS — I700 Atherosclerosis of aorta: Secondary | ICD-10-CM | POA: Diagnosis not present

## 2017-12-14 DIAGNOSIS — R160 Hepatomegaly, not elsewhere classified: Secondary | ICD-10-CM | POA: Diagnosis not present

## 2017-12-14 DIAGNOSIS — R1011 Right upper quadrant pain: Secondary | ICD-10-CM | POA: Diagnosis not present

## 2017-12-14 DIAGNOSIS — R918 Other nonspecific abnormal finding of lung field: Secondary | ICD-10-CM | POA: Diagnosis not present

## 2017-12-14 DIAGNOSIS — R911 Solitary pulmonary nodule: Secondary | ICD-10-CM | POA: Diagnosis not present

## 2017-12-29 DIAGNOSIS — Z6827 Body mass index (BMI) 27.0-27.9, adult: Secondary | ICD-10-CM | POA: Diagnosis not present

## 2017-12-29 DIAGNOSIS — N949 Unspecified condition associated with female genital organs and menstrual cycle: Secondary | ICD-10-CM | POA: Diagnosis not present

## 2017-12-29 DIAGNOSIS — J0100 Acute maxillary sinusitis, unspecified: Secondary | ICD-10-CM | POA: Diagnosis not present

## 2017-12-31 NOTE — Progress Notes (Signed)
Patient ID: Danielle Mccoy, female   DOB: 05-20-1945, 72 y.o.   MRN: [id number]   Patient presents stating shoes are uncomfortable and would like to have them adjusted.  Shoes are returned for a different style and size.  Patient will return when new shoes arrive.

## 2018-01-07 DIAGNOSIS — E1165 Type 2 diabetes mellitus with hyperglycemia: Secondary | ICD-10-CM | POA: Diagnosis not present

## 2018-01-07 DIAGNOSIS — F09 Unspecified mental disorder due to known physiological condition: Secondary | ICD-10-CM | POA: Diagnosis not present

## 2018-01-07 DIAGNOSIS — I10 Essential (primary) hypertension: Secondary | ICD-10-CM | POA: Diagnosis not present

## 2018-01-07 DIAGNOSIS — M797 Fibromyalgia: Secondary | ICD-10-CM | POA: Diagnosis not present

## 2018-01-24 DIAGNOSIS — Z6827 Body mass index (BMI) 27.0-27.9, adult: Secondary | ICD-10-CM | POA: Diagnosis not present

## 2018-01-24 DIAGNOSIS — N390 Urinary tract infection, site not specified: Secondary | ICD-10-CM | POA: Diagnosis not present

## 2018-01-24 DIAGNOSIS — N898 Other specified noninflammatory disorders of vagina: Secondary | ICD-10-CM | POA: Diagnosis not present

## 2018-01-24 DIAGNOSIS — E039 Hypothyroidism, unspecified: Secondary | ICD-10-CM | POA: Diagnosis not present

## 2018-01-24 DIAGNOSIS — K219 Gastro-esophageal reflux disease without esophagitis: Secondary | ICD-10-CM | POA: Diagnosis not present

## 2018-01-24 DIAGNOSIS — E663 Overweight: Secondary | ICD-10-CM | POA: Diagnosis not present

## 2018-01-24 DIAGNOSIS — I10 Essential (primary) hypertension: Secondary | ICD-10-CM | POA: Diagnosis not present

## 2018-01-24 DIAGNOSIS — E1165 Type 2 diabetes mellitus with hyperglycemia: Secondary | ICD-10-CM | POA: Diagnosis not present

## 2018-01-24 DIAGNOSIS — H6983 Other specified disorders of Eustachian tube, bilateral: Secondary | ICD-10-CM | POA: Diagnosis not present

## 2018-01-27 DIAGNOSIS — L299 Pruritus, unspecified: Secondary | ICD-10-CM | POA: Diagnosis not present

## 2018-01-27 DIAGNOSIS — L308 Other specified dermatitis: Secondary | ICD-10-CM | POA: Diagnosis not present

## 2018-01-27 DIAGNOSIS — D485 Neoplasm of uncertain behavior of skin: Secondary | ICD-10-CM | POA: Diagnosis not present

## 2018-01-27 DIAGNOSIS — L820 Inflamed seborrheic keratosis: Secondary | ICD-10-CM | POA: Diagnosis not present

## 2018-02-03 DIAGNOSIS — R531 Weakness: Secondary | ICD-10-CM | POA: Diagnosis not present

## 2018-02-03 DIAGNOSIS — E785 Hyperlipidemia, unspecified: Secondary | ICD-10-CM | POA: Diagnosis not present

## 2018-04-11 DIAGNOSIS — M797 Fibromyalgia: Secondary | ICD-10-CM | POA: Diagnosis not present

## 2018-04-11 DIAGNOSIS — I10 Essential (primary) hypertension: Secondary | ICD-10-CM | POA: Diagnosis not present

## 2018-04-11 DIAGNOSIS — E1165 Type 2 diabetes mellitus with hyperglycemia: Secondary | ICD-10-CM | POA: Diagnosis not present

## 2018-04-11 DIAGNOSIS — F09 Unspecified mental disorder due to known physiological condition: Secondary | ICD-10-CM | POA: Diagnosis not present

## 2018-06-08 DIAGNOSIS — I10 Essential (primary) hypertension: Secondary | ICD-10-CM | POA: Diagnosis not present

## 2018-06-08 DIAGNOSIS — J301 Allergic rhinitis due to pollen: Secondary | ICD-10-CM | POA: Diagnosis not present

## 2018-06-08 DIAGNOSIS — K5900 Constipation, unspecified: Secondary | ICD-10-CM | POA: Diagnosis not present

## 2018-06-08 DIAGNOSIS — E1165 Type 2 diabetes mellitus with hyperglycemia: Secondary | ICD-10-CM | POA: Diagnosis not present

## 2018-07-29 DIAGNOSIS — I10 Essential (primary) hypertension: Secondary | ICD-10-CM | POA: Diagnosis not present

## 2018-07-29 DIAGNOSIS — M797 Fibromyalgia: Secondary | ICD-10-CM | POA: Diagnosis not present

## 2018-07-29 DIAGNOSIS — F09 Unspecified mental disorder due to known physiological condition: Secondary | ICD-10-CM | POA: Diagnosis not present

## 2018-07-29 DIAGNOSIS — E1165 Type 2 diabetes mellitus with hyperglycemia: Secondary | ICD-10-CM | POA: Diagnosis not present

## 2018-08-09 DIAGNOSIS — Z1339 Encounter for screening examination for other mental health and behavioral disorders: Secondary | ICD-10-CM | POA: Diagnosis not present

## 2018-08-09 DIAGNOSIS — R911 Solitary pulmonary nodule: Secondary | ICD-10-CM | POA: Diagnosis not present

## 2018-08-09 DIAGNOSIS — Z0001 Encounter for general adult medical examination with abnormal findings: Secondary | ICD-10-CM | POA: Diagnosis not present

## 2018-08-09 DIAGNOSIS — Z7189 Other specified counseling: Secondary | ICD-10-CM | POA: Diagnosis not present

## 2018-08-09 DIAGNOSIS — E1165 Type 2 diabetes mellitus with hyperglycemia: Secondary | ICD-10-CM | POA: Diagnosis not present

## 2018-08-09 DIAGNOSIS — R55 Syncope and collapse: Secondary | ICD-10-CM | POA: Diagnosis not present

## 2018-08-09 DIAGNOSIS — I10 Essential (primary) hypertension: Secondary | ICD-10-CM | POA: Diagnosis not present

## 2018-08-09 DIAGNOSIS — E039 Hypothyroidism, unspecified: Secondary | ICD-10-CM | POA: Diagnosis not present

## 2018-08-09 DIAGNOSIS — R160 Hepatomegaly, not elsewhere classified: Secondary | ICD-10-CM | POA: Diagnosis not present

## 2018-08-09 DIAGNOSIS — E2839 Other primary ovarian failure: Secondary | ICD-10-CM | POA: Diagnosis not present

## 2018-08-09 DIAGNOSIS — Z6827 Body mass index (BMI) 27.0-27.9, adult: Secondary | ICD-10-CM | POA: Diagnosis not present

## 2018-08-09 DIAGNOSIS — Z79899 Other long term (current) drug therapy: Secondary | ICD-10-CM | POA: Diagnosis not present

## 2018-08-24 DIAGNOSIS — R911 Solitary pulmonary nodule: Secondary | ICD-10-CM | POA: Diagnosis not present

## 2018-08-24 DIAGNOSIS — R55 Syncope and collapse: Secondary | ICD-10-CM | POA: Diagnosis not present

## 2018-08-24 DIAGNOSIS — R918 Other nonspecific abnormal finding of lung field: Secondary | ICD-10-CM | POA: Diagnosis not present

## 2018-08-24 DIAGNOSIS — R42 Dizziness and giddiness: Secondary | ICD-10-CM | POA: Diagnosis not present

## 2018-08-24 DIAGNOSIS — R51 Headache: Secondary | ICD-10-CM | POA: Diagnosis not present

## 2018-08-25 DIAGNOSIS — R030 Elevated blood-pressure reading, without diagnosis of hypertension: Secondary | ICD-10-CM | POA: Diagnosis not present

## 2018-08-25 DIAGNOSIS — I361 Nonrheumatic tricuspid (valve) insufficiency: Secondary | ICD-10-CM | POA: Diagnosis not present

## 2018-08-25 DIAGNOSIS — Z6827 Body mass index (BMI) 27.0-27.9, adult: Secondary | ICD-10-CM | POA: Diagnosis not present

## 2018-08-25 DIAGNOSIS — I340 Nonrheumatic mitral (valve) insufficiency: Secondary | ICD-10-CM | POA: Diagnosis not present

## 2018-08-25 DIAGNOSIS — I10 Essential (primary) hypertension: Secondary | ICD-10-CM | POA: Diagnosis not present

## 2018-08-25 DIAGNOSIS — R55 Syncope and collapse: Secondary | ICD-10-CM | POA: Diagnosis not present

## 2018-09-07 DIAGNOSIS — E2839 Other primary ovarian failure: Secondary | ICD-10-CM | POA: Diagnosis not present

## 2018-09-07 DIAGNOSIS — M85852 Other specified disorders of bone density and structure, left thigh: Secondary | ICD-10-CM | POA: Diagnosis not present

## 2018-09-09 DIAGNOSIS — R51 Headache: Secondary | ICD-10-CM | POA: Diagnosis not present

## 2018-09-09 DIAGNOSIS — R109 Unspecified abdominal pain: Secondary | ICD-10-CM | POA: Diagnosis not present

## 2018-09-09 DIAGNOSIS — R55 Syncope and collapse: Secondary | ICD-10-CM | POA: Diagnosis not present

## 2018-09-09 DIAGNOSIS — I10 Essential (primary) hypertension: Secondary | ICD-10-CM | POA: Diagnosis not present

## 2018-09-09 DIAGNOSIS — R160 Hepatomegaly, not elsewhere classified: Secondary | ICD-10-CM | POA: Diagnosis not present

## 2018-09-09 DIAGNOSIS — R1084 Generalized abdominal pain: Secondary | ICD-10-CM | POA: Diagnosis not present

## 2018-09-15 ENCOUNTER — Other Ambulatory Visit: Payer: Self-pay

## 2018-10-18 DIAGNOSIS — E041 Nontoxic single thyroid nodule: Secondary | ICD-10-CM | POA: Diagnosis not present

## 2018-10-18 DIAGNOSIS — R42 Dizziness and giddiness: Secondary | ICD-10-CM | POA: Diagnosis not present

## 2018-11-01 DIAGNOSIS — E1165 Type 2 diabetes mellitus with hyperglycemia: Secondary | ICD-10-CM | POA: Diagnosis not present

## 2018-11-01 DIAGNOSIS — F419 Anxiety disorder, unspecified: Secondary | ICD-10-CM | POA: Diagnosis not present

## 2018-11-01 DIAGNOSIS — I10 Essential (primary) hypertension: Secondary | ICD-10-CM | POA: Diagnosis not present

## 2018-11-01 DIAGNOSIS — E041 Nontoxic single thyroid nodule: Secondary | ICD-10-CM | POA: Diagnosis not present

## 2018-11-16 DIAGNOSIS — E041 Nontoxic single thyroid nodule: Secondary | ICD-10-CM | POA: Diagnosis not present

## 2018-12-01 DIAGNOSIS — Z853 Personal history of malignant neoplasm of breast: Secondary | ICD-10-CM | POA: Diagnosis not present

## 2018-12-01 DIAGNOSIS — Z1231 Encounter for screening mammogram for malignant neoplasm of breast: Secondary | ICD-10-CM | POA: Diagnosis not present

## 2018-12-16 DIAGNOSIS — E041 Nontoxic single thyroid nodule: Secondary | ICD-10-CM | POA: Diagnosis not present

## 2018-12-19 DIAGNOSIS — N760 Acute vaginitis: Secondary | ICD-10-CM | POA: Diagnosis not present

## 2018-12-19 DIAGNOSIS — R300 Dysuria: Secondary | ICD-10-CM | POA: Diagnosis not present

## 2018-12-19 DIAGNOSIS — F419 Anxiety disorder, unspecified: Secondary | ICD-10-CM | POA: Diagnosis not present

## 2018-12-19 DIAGNOSIS — Z20828 Contact with and (suspected) exposure to other viral communicable diseases: Secondary | ICD-10-CM | POA: Diagnosis not present

## 2018-12-19 DIAGNOSIS — Z79899 Other long term (current) drug therapy: Secondary | ICD-10-CM | POA: Diagnosis not present

## 2018-12-19 DIAGNOSIS — E041 Nontoxic single thyroid nodule: Secondary | ICD-10-CM | POA: Diagnosis not present

## 2018-12-19 DIAGNOSIS — E559 Vitamin D deficiency, unspecified: Secondary | ICD-10-CM | POA: Diagnosis not present

## 2018-12-19 DIAGNOSIS — Z6826 Body mass index (BMI) 26.0-26.9, adult: Secondary | ICD-10-CM | POA: Diagnosis not present

## 2018-12-19 DIAGNOSIS — E538 Deficiency of other specified B group vitamins: Secondary | ICD-10-CM | POA: Diagnosis not present

## 2018-12-19 DIAGNOSIS — Z01812 Encounter for preprocedural laboratory examination: Secondary | ICD-10-CM | POA: Diagnosis not present

## 2018-12-21 DIAGNOSIS — Z01810 Encounter for preprocedural cardiovascular examination: Secondary | ICD-10-CM | POA: Diagnosis not present

## 2018-12-21 DIAGNOSIS — E041 Nontoxic single thyroid nodule: Secondary | ICD-10-CM | POA: Diagnosis not present

## 2018-12-23 DIAGNOSIS — E041 Nontoxic single thyroid nodule: Secondary | ICD-10-CM | POA: Diagnosis not present

## 2018-12-23 DIAGNOSIS — C73 Malignant neoplasm of thyroid gland: Secondary | ICD-10-CM | POA: Diagnosis not present

## 2018-12-23 DIAGNOSIS — E079 Disorder of thyroid, unspecified: Secondary | ICD-10-CM

## 2018-12-23 DIAGNOSIS — E1165 Type 2 diabetes mellitus with hyperglycemia: Secondary | ICD-10-CM | POA: Diagnosis not present

## 2018-12-23 HISTORY — DX: Disorder of thyroid, unspecified: E07.9

## 2018-12-24 DIAGNOSIS — E041 Nontoxic single thyroid nodule: Secondary | ICD-10-CM | POA: Diagnosis not present

## 2018-12-24 DIAGNOSIS — E1165 Type 2 diabetes mellitus with hyperglycemia: Secondary | ICD-10-CM | POA: Diagnosis not present

## 2019-03-07 ENCOUNTER — Telehealth: Payer: Self-pay | Admitting: Gastroenterology

## 2019-03-07 NOTE — Telephone Encounter (Signed)
Called and spoke with patient- patient reports she had a "bowel blockage" (BM) on Friday night and she manually removed this BM;   Patient reports she has also been bloated and been constipated for about 1-2 weeks -has been taking stool softener once daily (not helping) -used an enema (not able to use much)-not much relief  Miralax causes too much bloating (used last week)  Patient reports she has been very dehydrated and cannot get rehydrated- does not matter how much water she is taking in she "cannot feel better";  R sided pain related to constipation-removed a 3-4 inch wide and 15-18 inch long bowel movement on Friday night-R sided pain decreased once BM removed manually-R sided "pain is still present and is not going away" RN encouraged fresh fruits/veggies/increase oral intake/Miralax 2-3 x/day until "normal BM"-refuses to use due to bloating  Patient reports she is having abd pain and is concerned about this wants to know what to do? Please advise (pt has an appt with Colleen on 03/13/2019 at 3:00 pm)

## 2019-03-07 NOTE — Telephone Encounter (Signed)
Pt reported that she has bowel obstructions every time she eats food high in fibre.  She also stated that she has LUQ pain that feels like her ribs are being cracked.  Please advise.

## 2019-03-08 NOTE — Telephone Encounter (Signed)
Thanks for setting up appointment with Jaclyn Shaggy RG

## 2019-03-12 NOTE — Progress Notes (Signed)
03/12/2019 Danielle Mccoy [id number] 1945-04-10   Chief Complaint: constipation x 2 weeks, ?  bowel obstruction   HISTORY OF PRESENT ILLNESS: Danielle Mccoy is a 74 year old female with a past medical history of anxiety, fibromyalgia, hypertension, DM II on insulin, pancreatitis, GERD, breast cancer s/p left mastectomy 1989,  thyroid cancer 2020 s/p total thyroidectomy 12/23/2018. Past hysterectomy appendectomy and cholecystectomy. She presents today with complaints of having constipation for 2 weeks which worsened after she increased her dietary fiber. She was concerned she had a bowel  blockage. She felt constipated, no BM for 2 days then on 03/03/2019 she was concerned she had a bowel blockage. She developed upper abdominal bloat with pressure under her right and left rib cage. She passed a few hard balls of stool. She used an enema but she was unable to squeeze in all of the enema fluid. She then passed a long solid stool described as 15 inches long, she described feeling like she gave birth. Her abdominal bloat and discomfort significantly improved. She is now taking a stool softener/laxatie capsule once daily which results in passing a normal formed brown stool once daily. No rectal bleeding or black stool. She complains of mild central abdominal pain since passing the large BM on 1/15. No nausea or vomiting. No fever. No weight loss. Her most recent colonoscopy was done 11/14/2009 by Dr. Lyndel Safe which showed mild sigmoid diverticulosis and internal hemorrhoids. She was advised to repeat a colonoscopy in 10 yeas. Colonoscopy 05/03/2003 by Dr. Olevia Perches was normal. No family history of colon cancer.     Past Medical History:  Diagnosis Date  . Anxiety   . Cancer ([place])    breast  . Cervicalgia   . Diabetes mellitus   . Fibromyalgia   . GERD (gastroesophageal reflux disease)   . History of pneumonia   . Hypertension   . IBS (irritable bowel syndrome)   . Pancreatitis   . Panic attacks     . Varicose veins    Past Surgical History:  Procedure Laterality Date  . ABDOMINAL HYSTERECTOMY  1988  . APPENDECTOMY    . blood clot removal  1990   right arm  . BREAST LUMPECTOMY  2011  . CHOLECYSTECTOMY  2008   Gall bladder  . MASTECTOMY  1989  . NERVE TRANSFER FOR MUSCLE REPAIR, FOREARM     left arm  . SHOULDER SURGERY  1997  . SPINE SURGERY     nerve cyst removal   . TONSILLECTOMY AND ADENOIDECTOMY  1974  . TUBAL LIGATION  1977    reports that she has never smoked. She has never used smokeless tobacco. She reports that she does not drink alcohol or use drugs. family history is not on file.   Current Outpatient Medications on File Prior to Visit  Medication Sig Dispense Refill  . calcium-vitamin D 250-100 MG-UNIT tablet Take 1 tablet by mouth daily.    . fluticasone (FLONASE) 50 MCG/ACT nasal spray Place 2 sprays into the nose daily as needed.      . insulin NPH Human (HUMULIN N,NOVOLIN N) 100 UNIT/ML injection Inject into the skin.    Marland Kitchen insulin regular (NOVOLIN R,HUMULIN R) 100 units/mL injection Inject into the skin.    Marland Kitchen levothyroxine (SYNTHROID) 75 MCG tablet Take 75 mcg by mouth daily before breakfast.    . LORazepam (ATIVAN) 0.5 MG tablet Take 0.5 mg by mouth every 8 (eight) hours.      Marland Kitchen losartan (COZAAR)  50 MG tablet Take 75 mg by mouth daily.    . Milk Thistle 250 MG CAPS Take 250 mg by mouth daily.      . NONFORMULARY OR COMPOUNDED ITEM Apply 1-2 g topically 4 (four) times daily as needed. 120 each 2  . pantoprazole (PROTONIX) 40 MG tablet Take 40 mg by mouth daily.      . prochlorperazine (COMPAZINE) 5 MG tablet Take 5 mg by mouth every 6 (six) hours as needed.      . venlafaxine (EFFEXOR) 25 MG tablet Take by mouth. 1/4 tablet once daily      No current facility-administered medications on file prior to visit.    Allergies  Allergen Reactions  . Aspirin     Causes gastric problems   . Cholesterol   . Divalproex Sodium     Causes numbness  .  Erythromycin     Causes severe headache  . Inapsine [Droperidol]   . Methylprednisolone     Causes sweating  . Metronidazole     Causes severe headaches   . Morphine Sulfate Nausea And Vomiting  . Nortriptyline Hcl     Causes blurred vision  . Nutritional Supplements     Causes gastric problems  . Omnipaque [Iohexol]     Causes patient to become non-responsive  . Smz-Tmp Ds [Sulfamethoxazole W/Trimethoprim (Co-Trimoxazole)]     Causes sore throat  . Sulfa Antibiotics     Causes severe headaches  . Valium Nausea And Vomiting    REVIEW OF SYSTEMS  : All other systems reviewed and negative except where noted in the History of Present Illness.   PHYSICAL EXAM: BP 130/82   Pulse 92   Ht 5\' 4"  (1.626 m)   Wt 153 lb (69.4 kg)   BMI 26.26 kg/m  General: Well developed 74 year old female in no acute distress. Head: Normocephalic and atraumatic. Eyes:  Sclerae non-icteric, conjunctive pink. Ears: Normal auditory acuity. Mouth: Dentition intact. No ulcers or lesions.  Neck: Supple, no lymphadenopathy or thyromegaly.  Lungs: Clear bilaterally to auscultation without wheezes, crackles or rhonchi. Heart: Regular rate and rhythm. No murmur, rub or gallop appreciated.  Abdomen: Soft, mild tenderness above the umbilicus without rebound or guarding.  Non distended. No masses. No hepatosplenomegaly. Normoactive bowel sounds x 4 quadrants.  Rectal: Deferred.  Musculoskeletal: Symmetrical with no gross deformities. Skin: Warm and dry. No rash or lesions on visible extremities. Extremities: No edema. Neurological: Alert oriented x 4, no focal deficits.  Psychological:  Alert and cooperative. Normal mood and affect.  ASSESSMENT AND PLAN:  23. 74 year old female with constipation without obstruction. Mild central abdominal pain.  -Continue stool softener/laxative one to two capsules daily -If needed may take Miralax daily -Colonoscopy benefits and risks discussed including risks with  sedation, risk of bleeding, perforation and infection. She does not wish to schedule a colonoscopy at this time, to further discuss at the time of her recommended follow up appointment in 3 months. -She will call our office if her symptoms recur -CBC, CMP and CRP  2. GERD, stable on Pantoprazole 40mg  QD  3. History of breast ca  4. History of thyroid cancer    CC:  Ernestene Kiel, MD

## 2019-03-13 ENCOUNTER — Ambulatory Visit (INDEPENDENT_AMBULATORY_CARE_PROVIDER_SITE_OTHER): Payer: Medicare Other | Admitting: Nurse Practitioner

## 2019-03-13 ENCOUNTER — Other Ambulatory Visit: Payer: Self-pay

## 2019-03-13 ENCOUNTER — Encounter: Payer: Self-pay | Admitting: Nurse Practitioner

## 2019-03-13 VITALS — BP 130/82 | HR 92 | Ht 64.0 in | Wt 153.0 lb

## 2019-03-13 DIAGNOSIS — K5909 Other constipation: Secondary | ICD-10-CM

## 2019-03-13 DIAGNOSIS — K5900 Constipation, unspecified: Secondary | ICD-10-CM | POA: Diagnosis not present

## 2019-03-13 DIAGNOSIS — R1010 Upper abdominal pain, unspecified: Secondary | ICD-10-CM

## 2019-03-13 DIAGNOSIS — K219 Gastro-esophageal reflux disease without esophagitis: Secondary | ICD-10-CM

## 2019-03-13 DIAGNOSIS — R1033 Periumbilical pain: Secondary | ICD-10-CM | POA: Diagnosis not present

## 2019-03-13 DIAGNOSIS — Z853 Personal history of malignant neoplasm of breast: Secondary | ICD-10-CM | POA: Diagnosis not present

## 2019-03-13 DIAGNOSIS — C73 Malignant neoplasm of thyroid gland: Secondary | ICD-10-CM | POA: Diagnosis not present

## 2019-03-13 NOTE — Patient Instructions (Addendum)
If you are age 74 or older, your body mass index should be between 23-30. Your Body mass index is 26.26 kg/m. If this is out of the aforementioned range listed, please consider follow up with your Primary Care Provider.  If you are age 7 or younger, your body mass index should be between 19-25. Your Body mass index is 26.26 kg/m. If this is out of the aformentioned range listed, please consider follow up with your Primary Care Provider.   1. Please take Miralax at bedtime as needed. 2. Drink 6-8 glasses of water daily. 3. Call the office if your symptoms worsen, [phone]. 4. Follow up in 3 months. 5. Please obtain copies of your most recent imaging for our record.  Your provider has requested that you have lab work today. We ask that you go to our [facility] office at [address] [place], [place] [address]. Please enter through the main entrance and go to the elevator.  Press "B" on the elevator. The lab is located at the first door on the left as you exit the elevator.  It has been recommended to you by your physician that you have a(n)Colonoscopy completed. Per your request, we did not schedule the procedure(s) today. Please contact our office at [phone] should you decide to have the procedure completed.   Due to recent changes in healthcare laws, you may see the results of your imaging and laboratory studies on MyChart before your provider has had a chance to review them.  We understand that in some cases there may be results that are confusing or concerning to you. Not all laboratory results come back in the same time frame and the provider may be waiting for multiple results in order to interpret others.  Please give [place] 48 hours in order for your provider to thoroughly review all the results before contacting the office for clarification of your results.   Thank you for choosing [facility] Noralyn Pick, CRNP

## 2019-03-14 ENCOUNTER — Telehealth: Payer: Self-pay | Admitting: Nurse Practitioner

## 2019-03-14 ENCOUNTER — Other Ambulatory Visit (INDEPENDENT_AMBULATORY_CARE_PROVIDER_SITE_OTHER): Payer: Medicare Other

## 2019-03-14 DIAGNOSIS — R1010 Upper abdominal pain, unspecified: Secondary | ICD-10-CM | POA: Diagnosis not present

## 2019-03-14 DIAGNOSIS — K5900 Constipation, unspecified: Secondary | ICD-10-CM

## 2019-03-14 DIAGNOSIS — R1033 Periumbilical pain: Secondary | ICD-10-CM | POA: Diagnosis not present

## 2019-03-14 LAB — CBC WITH DIFFERENTIAL/PLATELET
Basophils Absolute: 0.1 10*3/uL (ref 0.0–0.1)
Basophils Relative: 1.1 % (ref 0.0–3.0)
Eosinophils Absolute: 0.2 10*3/uL (ref 0.0–0.7)
Eosinophils Relative: 2.9 % (ref 0.0–5.0)
HCT: 42.1 % (ref 36.0–46.0)
Hemoglobin: 14.2 g/dL (ref 12.0–15.0)
Lymphocytes Relative: 27.3 % (ref 12.0–46.0)
Lymphs Abs: 1.6 10*3/uL (ref 0.7–4.0)
MCHC: 33.8 g/dL (ref 30.0–36.0)
MCV: 84.6 fl (ref 78.0–100.0)
Monocytes Absolute: 0.6 10*3/uL (ref 0.1–1.0)
Monocytes Relative: 9.7 % (ref 3.0–12.0)
Neutro Abs: 3.6 10*3/uL (ref 1.4–7.7)
Neutrophils Relative %: 59 % (ref 43.0–77.0)
Platelets: 254 10*3/uL (ref 150.0–400.0)
RBC: 4.98 Mil/uL (ref 3.87–5.11)
RDW: 14 % (ref 11.5–15.5)
WBC: 6 10*3/uL (ref 4.0–10.5)

## 2019-03-14 LAB — COMPREHENSIVE METABOLIC PANEL
ALT: 10 U/L (ref 0–35)
AST: 13 U/L (ref 0–37)
Albumin: 4.1 g/dL (ref 3.5–5.2)
Alkaline Phosphatase: 117 U/L (ref 39–117)
BUN: 9 mg/dL (ref 6–23)
CO2: 30 mEq/L (ref 19–32)
Calcium: 9.2 mg/dL (ref 8.4–10.5)
Chloride: 105 mEq/L (ref 96–112)
Creatinine, Ser: 0.79 mg/dL (ref 0.40–1.20)
GFR: 71.24 mL/min (ref >60.00)
Glucose, Bld: 149 mg/dL — ABNORMAL HIGH (ref 70–99)
Potassium: 3.6 mEq/L (ref 3.5–5.1)
Sodium: 142 mEq/L (ref 135–145)
Total Bilirubin: 0.6 mg/dL (ref 0.2–1.2)
Total Protein: 6.4 g/dL (ref 6.0–8.3)

## 2019-03-14 LAB — C-REACTIVE PROTEIN: CRP: <1 mg/dL (ref 0.5–20.0)

## 2019-03-14 NOTE — Telephone Encounter (Signed)
Danielle Mccoy, Did you call this patient? If so, please call back. Thanks Bre

## 2019-03-14 NOTE — Telephone Encounter (Signed)
Did one of you try to call this patient? I don't see anything documented. Thanks Bre

## 2019-03-14 NOTE — Telephone Encounter (Signed)
I did not call pt. 

## 2019-03-14 NOTE — Progress Notes (Signed)
Agree with the plan. Proceed with colonoscopy.  Trish, Can we get it done on Friday Feb 12?-We were able to open that day for procedures RG

## 2019-03-15 NOTE — Telephone Encounter (Signed)
I have returned patients call. Dr. Lyndel Safe had requested that patient have a colonoscopy. Patient does not want a colonoscopy at this time.

## 2019-03-16 DIAGNOSIS — E1165 Type 2 diabetes mellitus with hyperglycemia: Secondary | ICD-10-CM | POA: Diagnosis not present

## 2019-03-16 DIAGNOSIS — I10 Essential (primary) hypertension: Secondary | ICD-10-CM | POA: Diagnosis not present

## 2019-03-16 DIAGNOSIS — E890 Postprocedural hypothyroidism: Secondary | ICD-10-CM | POA: Diagnosis not present

## 2019-03-16 DIAGNOSIS — C73 Malignant neoplasm of thyroid gland: Secondary | ICD-10-CM | POA: Diagnosis not present

## 2019-03-16 NOTE — Progress Notes (Signed)
Danielle Mccoy, Thanks for letting me know - patient refusing colonoscopy. In that case, lets get CT Abdo/pelvis with p.o. and IV contrast. R/O PSBO, abdominal pain.  Thx  RG

## 2019-03-20 ENCOUNTER — Telehealth: Payer: Self-pay | Admitting: Nurse Practitioner

## 2019-03-20 DIAGNOSIS — R1033 Periumbilical pain: Secondary | ICD-10-CM

## 2019-03-20 DIAGNOSIS — K5900 Constipation, unspecified: Secondary | ICD-10-CM

## 2019-03-20 DIAGNOSIS — R1010 Upper abdominal pain, unspecified: Secondary | ICD-10-CM

## 2019-03-20 NOTE — Telephone Encounter (Signed)
Danielle Mccoy,  I would advised the patient to take Milk of Magnesia 2 to 4 Tablespoons tonight then call office in am with response. I would have her proceed with the abd/pelvic CT with oral contrast only. Schedule a colonoscopy after CT completed and results reviewed. Thank you.

## 2019-03-20 NOTE — Telephone Encounter (Signed)
Patient advised and voiced understanding. Patient is scheduled for Ct Scan at [facility] on 03/24/19. Patient is aware and is going to come pick up directions on Thursday.

## 2019-03-20 NOTE — Telephone Encounter (Signed)
I have called and spoke to Radiology they said the patient can have oral contrast but can not have IV contrast.  I have also called and spoke to the patient who is complaing of more constipation. Patient sates that she has used Miralax with very little relief and when she did pass stool it came out in a thin ribbon like form. Patient now says that she is willing to proceed with Colonoscopy. Would you like her to proceed with colonoscopy instead of CT Scan? Also patient would like to know if there is anything else she can do for the constipation.

## 2019-03-20 NOTE — Addendum Note (Signed)
Addended by: Karena Addison on: 03/20/2019 03:33 PM   Modules accepted: Orders

## 2019-03-20 NOTE — Telephone Encounter (Signed)
Danielle Mccoy, See addendum to office consult note 03/13/2019 by Dr Lyndel Safe.  Dr. Lyndel Safe requested an abd/pelvic CT with oral and IV contrast, requested for you to set up. Please note, patient is allergic to Omnipaque contrast (which can be in oral and IV contrast). Please contact the radiology office to verify if they have an alternative contrast.  If not, her CT will need to be without contrast. Pls let me know outcome. thx

## 2019-03-23 NOTE — Telephone Encounter (Signed)
Pt reported that she went into anaphylactic shock when she was given IVP dye.  She stated that the contrast that she picked up contains iodine and is concerned.  Please call pt back.

## 2019-03-23 NOTE — Telephone Encounter (Signed)
Patient is concerned about taking the oral contrast. She is scheduled tomorrow at 11am. Is it safe for patient to drink the oral contrast. Does she need to be pre treated?

## 2019-03-24 ENCOUNTER — Ambulatory Visit (HOSPITAL_BASED_OUTPATIENT_CLINIC_OR_DEPARTMENT_OTHER): Payer: Medicare Other

## 2019-03-24 ENCOUNTER — Telehealth: Payer: Self-pay

## 2019-03-24 NOTE — Telephone Encounter (Signed)
Spoke to patient this morning who states she was not feeling well and has cancelled her CT appointment for this morning. She called [facility] and spoke with a tech regarding the questionable allergy to the oral contrast. Patient was reassured that it is safe  to drink oral contrast as it does not contain any contrast dye. CT with oral contrast has been rescheduled for 03/30/19.

## 2019-03-24 NOTE — Telephone Encounter (Signed)
Patient informed. 

## 2019-03-24 NOTE — Telephone Encounter (Signed)
It would be safe for her to drink oral contrast RG

## 2019-03-30 ENCOUNTER — Other Ambulatory Visit: Payer: Self-pay

## 2019-03-30 ENCOUNTER — Ambulatory Visit (HOSPITAL_BASED_OUTPATIENT_CLINIC_OR_DEPARTMENT_OTHER)
Admission: RE | Admit: 2019-03-30 | Discharge: 2019-03-30 | Disposition: A | Discharge status: Home / Self Care | Payer: Medicare Other | Source: Ambulatory Visit | Attending: Gastroenterology | Admitting: Gastroenterology

## 2019-03-30 DIAGNOSIS — K5900 Constipation, unspecified: Secondary | ICD-10-CM | POA: Diagnosis not present

## 2019-03-30 DIAGNOSIS — R1010 Upper abdominal pain, unspecified: Secondary | ICD-10-CM | POA: Diagnosis not present

## 2019-03-30 DIAGNOSIS — R1033 Periumbilical pain: Secondary | ICD-10-CM | POA: Insufficient documentation

## 2019-03-31 ENCOUNTER — Other Ambulatory Visit: Payer: Self-pay

## 2019-03-31 DIAGNOSIS — K5909 Other constipation: Secondary | ICD-10-CM

## 2019-03-31 MED ORDER — POLYETHYLENE GLYCOL 3350 17 GM/SCOOP PO POWD: 17.0000 g | Freq: Two times a day (BID) | ORAL | 3 refills | Status: AC

## 2019-04-12 ENCOUNTER — Telehealth: Payer: Self-pay | Admitting: Nurse Practitioner

## 2019-04-13 NOTE — Telephone Encounter (Signed)
Contacted the patient and she stated it is the same pain she has had all along on her right side. She was instructed to cut back her miralax to every other day per [place]. The patient verbalized understanding and will call back with report next week.

## 2019-04-18 DIAGNOSIS — Z6825 Body mass index (BMI) 25.0-25.9, adult: Secondary | ICD-10-CM | POA: Diagnosis not present

## 2019-04-18 DIAGNOSIS — E039 Hypothyroidism, unspecified: Secondary | ICD-10-CM | POA: Diagnosis not present

## 2019-04-18 DIAGNOSIS — K219 Gastro-esophageal reflux disease without esophagitis: Secondary | ICD-10-CM | POA: Diagnosis not present

## 2019-04-18 DIAGNOSIS — Z1331 Encounter for screening for depression: Secondary | ICD-10-CM | POA: Diagnosis not present

## 2019-04-18 DIAGNOSIS — E1165 Type 2 diabetes mellitus with hyperglycemia: Secondary | ICD-10-CM | POA: Diagnosis not present

## 2019-04-18 DIAGNOSIS — F419 Anxiety disorder, unspecified: Secondary | ICD-10-CM | POA: Diagnosis not present

## 2019-04-18 DIAGNOSIS — K5900 Constipation, unspecified: Secondary | ICD-10-CM | POA: Diagnosis not present

## 2019-04-18 DIAGNOSIS — Z79899 Other long term (current) drug therapy: Secondary | ICD-10-CM | POA: Diagnosis not present

## 2019-04-18 DIAGNOSIS — I10 Essential (primary) hypertension: Secondary | ICD-10-CM | POA: Diagnosis not present

## 2019-04-25 ENCOUNTER — Encounter: Payer: Self-pay | Admitting: Gastroenterology

## 2019-05-16 ENCOUNTER — Other Ambulatory Visit: Payer: Self-pay

## 2019-05-16 ENCOUNTER — Ambulatory Visit (AMBULATORY_SURGERY_CENTER): Payer: Self-pay

## 2019-05-16 VITALS — Temp 97.0°F | Ht 64.0 in | Wt 155.0 lb

## 2019-05-16 DIAGNOSIS — Z01818 Encounter for other preprocedural examination: Secondary | ICD-10-CM

## 2019-05-16 DIAGNOSIS — Z86010 Personal history of colonic polyps: Secondary | ICD-10-CM

## 2019-05-16 MED ORDER — NA SULFATE-K SULFATE-MG SULF 17.5-3.13-1.6 GM/177ML PO SOLN: 1.0000 | Freq: Once | ORAL | 0 refills | Status: AC

## 2019-05-16 NOTE — Progress Notes (Signed)
No egg or soy allergy known to patient  No issues with past sedation with any surgeries  or procedures, no intubation problems  No diet pills per patient No home 02 use per patient  No blood thinners per patient   Pt states issues with constipation and "blockages"  2 DAY PREP- suprep and miralax  No A fib or A flutter  EMMI video sent to pt's e mail   suprep sample given [id number], EXP 02/2021    Due to the COVID-19 pandemic we are asking patients to follow these guidelines. Please only bring one care partner. Please be aware that your care partner may wait in the car in the parking lot or if they feel like they will be too hot to wait in the car, they may wait in the lobby on the 4th floor. All care partners are required to wear a mask the entire time (we do not have any that we can provide them), they need to practice social distancing, and we will do a Covid check for all patient's and care partners when you arrive. Also we will check their temperature and your temperature. If the care partner waits in their car they need to stay in the parking lot the entire time and we will call them on their cell phone when the patient is ready for discharge so they can bring the car to the front of the building. Also all patient's will need to wear a mask into building.

## 2019-05-25 ENCOUNTER — Ambulatory Visit (INDEPENDENT_AMBULATORY_CARE_PROVIDER_SITE_OTHER): Payer: Medicare Other

## 2019-05-25 ENCOUNTER — Other Ambulatory Visit: Payer: Self-pay | Admitting: Gastroenterology

## 2019-05-25 ENCOUNTER — Other Ambulatory Visit: Payer: Self-pay

## 2019-05-25 DIAGNOSIS — Z1159 Encounter for screening for other viral diseases: Secondary | ICD-10-CM

## 2019-05-26 LAB — SARS CORONAVIRUS 2 (TAT 6-24 HRS): SARS Coronavirus 2: NEGATIVE

## 2019-05-30 ENCOUNTER — Other Ambulatory Visit: Payer: Self-pay

## 2019-05-30 ENCOUNTER — Ambulatory Visit (AMBULATORY_SURGERY_CENTER): Payer: Medicare Other | Admitting: Gastroenterology

## 2019-05-30 ENCOUNTER — Encounter: Payer: Self-pay | Admitting: Gastroenterology

## 2019-05-30 VITALS — BP 164/74 | HR 78 | Temp 96.8°F | Resp 7 | Ht 64.0 in | Wt 155.0 lb

## 2019-05-30 DIAGNOSIS — I10 Essential (primary) hypertension: Secondary | ICD-10-CM | POA: Diagnosis not present

## 2019-05-30 DIAGNOSIS — E109 Type 1 diabetes mellitus without complications: Secondary | ICD-10-CM | POA: Diagnosis not present

## 2019-05-30 DIAGNOSIS — F411 Generalized anxiety disorder: Secondary | ICD-10-CM | POA: Diagnosis not present

## 2019-05-30 DIAGNOSIS — D120 Benign neoplasm of cecum: Secondary | ICD-10-CM | POA: Diagnosis not present

## 2019-05-30 DIAGNOSIS — D123 Benign neoplasm of transverse colon: Secondary | ICD-10-CM

## 2019-05-30 DIAGNOSIS — K5900 Constipation, unspecified: Secondary | ICD-10-CM

## 2019-05-30 DIAGNOSIS — D125 Benign neoplasm of sigmoid colon: Secondary | ICD-10-CM

## 2019-05-30 DIAGNOSIS — Z86010 Personal history of colonic polyps: Secondary | ICD-10-CM

## 2019-05-30 DIAGNOSIS — M797 Fibromyalgia: Secondary | ICD-10-CM | POA: Diagnosis not present

## 2019-05-30 MED ORDER — SODIUM CHLORIDE 0.9 % IV SOLN: 500.0000 mL | Freq: Once | INTRAVENOUS | Status: DC

## 2019-05-30 NOTE — Patient Instructions (Signed)
Please read handouts provided. Continue present medications. Miralax 1 capful ( 17 grams ) in 8 ounces of water daily. Return to GI clinic in 12 weeks. Await pathology results.      YOU HAD AN ENDOSCOPIC PROCEDURE TODAY AT [facility]:   Refer to the procedure report that was given to you for any specific questions about what was found during the examination.  If the procedure report does not answer your questions, please call your gastroenterologist to clarify.  If you requested that your care partner not be given the details of your procedure findings, then the procedure report has been included in a sealed envelope for you to review at your convenience later.  YOU SHOULD EXPECT: Some feelings of bloating in the abdomen. Passage of more gas than usual.  Walking can help get rid of the air that was put into your GI tract during the procedure and reduce the bloating. If you had a lower endoscopy (such as a colonoscopy or flexible sigmoidoscopy) you may notice spotting of blood in your stool or on the toilet paper. If you underwent a bowel prep for your procedure, you may not have a normal bowel movement for a few days.  Please Note:  You might notice some irritation and congestion in your nose or some drainage.  This is from the oxygen used during your procedure.  There is no need for concern and it should clear up in a day or so.  SYMPTOMS TO REPORT IMMEDIATELY:   Following lower endoscopy (colonoscopy or flexible sigmoidoscopy):  Excessive amounts of blood in the stool  Significant tenderness or worsening of abdominal pains  Swelling of the abdomen that is new, acute  Fever of 100F or higher   For urgent or emergent issues, a gastroenterologist can be reached at any hour by calling [phone]. Do not use MyChart messaging for urgent concerns.    DIET:  We do recommend a small meal at first, but then you may proceed to your regular diet.  Drink plenty of fluids  but you should avoid alcoholic beverages for 24 hours.  ACTIVITY:  You should plan to take it easy for the rest of today and you should NOT DRIVE or use heavy machinery until tomorrow (because of the sedation medicines used during the test).    FOLLOW UP: Our staff will call the number listed on your records 48-72 hours following your procedure to check on you and address any questions or concerns that you may have regarding the information given to you following your procedure. If we do not reach you, we will leave a message.  We will attempt to reach you two times.  During this call, we will ask if you have developed any symptoms of COVID 19. If you develop any symptoms (ie: fever, flu-like symptoms, shortness of breath, cough etc.) before then, please call [phone].  If you test positive for Covid 19 in the 2 weeks post procedure, please call and report this information to [place].    If any biopsies were taken you will be contacted by phone or by letter within the next 1-3 weeks.  Please call us at [phone] if you have not heard about the biopsies in 3 weeks.    SIGNATURES/CONFIDENTIALITY: You and/or your care partner have signed paperwork which will be entered into your electronic medical record.  These signatures attest to the fact that that the information above on your After Visit Summary has been reviewed and is  understood.  Full responsibility of the confidentiality of this discharge information lies with you and/or your care-partner. 

## 2019-05-30 NOTE — Op Note (Signed)
[facility] Patient Name: Danielle Mccoy Procedure Date: 05/30/2019 10:50 AM MRN: [id number] Endoscopist: Jackquline Denmark , MD Age: 74 Referring MD:  Date of Birth: Sep 26, 1945 Gender: Female Account #: [id number] Procedure:                Colonoscopy Indications:              High risk colon cancer surveillance: Personal                            history of colonic polyps Medicines:                Monitored Anesthesia Care Procedure:                Pre-Anesthesia Assessment:                           - Prior to the procedure, a History and Physical                            was performed, and patient medications and                            allergies were reviewed. The patient's tolerance of                            previous anesthesia was also reviewed. The risks                            and benefits of the procedure and the sedation                            options and risks were discussed with the patient.                            All questions were answered, and informed consent                            was obtained. Prior Anticoagulants: The patient has                            taken no previous anticoagulant or antiplatelet                            agents. ASA Grade Assessment: II - A patient with                            mild systemic disease. After reviewing the risks                            and benefits, the patient was deemed in                            satisfactory condition to undergo the procedure.  After obtaining informed consent, the colonoscope                            was passed under direct vision. Throughout the                            procedure, the patient's blood pressure, pulse, and                            oxygen saturations were monitored continuously. The                            Colonoscope was introduced through the anus and                            advanced to the 2 cm into the ileum. The                             colonoscopy was performed without difficulty. The                            patient tolerated the procedure well. The quality                            of the bowel preparation was good. The terminal                            ileum, ileocecal valve, appendiceal orifice, and                            rectum were photographed. Scope In: 10:56:48 AM Scope Out: 11:19:00 AM Scope Withdrawal Time: 0 hours 15 minutes 29 seconds  Total Procedure Duration: 0 hours 22 minutes 12 seconds  Findings:                 Three sessile polyps were found in the sigmoid                            colon, transverse colon and cecum. The polyps were                            4 to 6 mm in size. These polyps were removed with a                            cold snare. Resection and retrieval were complete.                           A 10 mm polyp was found in the mid transverse                            colon. The polyp was sessile. The polyp was removed  with a hot snare. Resection and retrieval were                            complete.                           A few small-mouthed diverticula were found in the                            sigmoid colon.                           Non-bleeding internal hemorrhoids were found during                            retroflexion. The hemorrhoids were small.                           The terminal ileum appeared normal.                           The exam was otherwise without abnormality on                            direct and retroflexion views. The colon was highly                            redundant. Complications:            No immediate complications. Estimated Blood Loss:     Estimated blood loss: none. Impression:               -Colonic polyps s/p polypectomy.                           -Mild sigmoid diverticulosis.                           -Non-bleeding internal hemorrhoids.                            -Otherwise normal colonoscopy to TI. The colon was                            highly redundant. Recommendation:           - Patient has a contact number available for                            emergencies. The signs and symptoms of potential                            delayed complications were discussed with the                            patient. Return to normal activities tomorrow.  Written discharge instructions were provided to the                            patient.                           - Resume previous diet.                           - Continue present medications.                           - Await pathology results.                           - Repeat colonoscopy for surveillance based on                            pathology results.                           - Miralax 1 capful (17 grams) in 8 ounces of water                            PO daily.                           - Return to GI clinic in 12 weeks. Jackquline Denmark, MD 05/30/2019 11:25:44 AM This report has been signed electronically.

## 2019-05-30 NOTE — Progress Notes (Signed)
Vitals-CW Temp-LC  Pt's states no medical or surgical changes since previsit or office visit.  Patient is a poor historian.  RN could not get a full history of her medications.

## 2019-05-30 NOTE — Progress Notes (Signed)
Report to PACU, RN, vss, BBS= Clear.  

## 2019-05-30 NOTE — Progress Notes (Signed)
Called to room to assist during endoscopic procedure.  Patient ID and intended procedure confirmed with present staff. Received instructions for my participation in the procedure from the performing physician.  

## 2019-06-01 ENCOUNTER — Telehealth: Payer: Self-pay

## 2019-06-01 ENCOUNTER — Telehealth: Payer: Self-pay | Admitting: *Deleted

## 2019-06-01 NOTE — Telephone Encounter (Signed)
  Follow up Call-  Call back number 05/30/2019  Post procedure Call Back phone  # [phone]  Permission to leave phone message Yes  Some recent data might be hidden     Patient questions:  Message left to call us if necessary.

## 2019-06-01 NOTE — Telephone Encounter (Signed)
  Follow up Call-  Call back number 05/30/2019  Post procedure Call Back phone  # [phone]  Permission to leave phone message Yes  Some recent data might be hidden     Patient questions:  Do you have a fever, pain , or abdominal swelling? No. Pain Score  0 *  Have you tolerated food without any problems? Yes.    Have you been able to return to your normal activities? Yes.    Do you have any questions about your discharge instructions: Diet   No. Medications  No. Follow up visit  No.  Do you have questions or concerns about your Care? No.  Actions: * If pain score is 4 or above: No action needed, pain <4.  1. Have you developed a fever since your procedure? no  2.   Have you had an respiratory symptoms (SOB or cough) since your procedure? no  3.   Have you tested positive for COVID 19 since your procedure no  4.   Have you had any family members/close contacts diagnosed with the COVID 19 since your procedure?  no   If yes to any of these questions please route to Joylene John, RN and Erenest Rasher, RN

## 2019-06-04 ENCOUNTER — Encounter: Payer: Self-pay | Admitting: Gastroenterology

## 2019-06-06 DIAGNOSIS — R1011 Right upper quadrant pain: Secondary | ICD-10-CM | POA: Diagnosis not present

## 2019-06-06 DIAGNOSIS — E039 Hypothyroidism, unspecified: Secondary | ICD-10-CM | POA: Diagnosis not present

## 2019-07-25 DIAGNOSIS — I10 Essential (primary) hypertension: Secondary | ICD-10-CM | POA: Diagnosis not present

## 2019-07-25 DIAGNOSIS — C73 Malignant neoplasm of thyroid gland: Secondary | ICD-10-CM | POA: Diagnosis not present

## 2019-07-25 DIAGNOSIS — E890 Postprocedural hypothyroidism: Secondary | ICD-10-CM | POA: Diagnosis not present

## 2019-07-25 DIAGNOSIS — E1165 Type 2 diabetes mellitus with hyperglycemia: Secondary | ICD-10-CM | POA: Diagnosis not present

## 2019-08-09 DIAGNOSIS — Z0000 Encounter for general adult medical examination without abnormal findings: Secondary | ICD-10-CM | POA: Diagnosis not present

## 2019-08-09 DIAGNOSIS — Z1331 Encounter for screening for depression: Secondary | ICD-10-CM | POA: Diagnosis not present

## 2019-08-09 DIAGNOSIS — K219 Gastro-esophageal reflux disease without esophagitis: Secondary | ICD-10-CM | POA: Diagnosis not present

## 2019-08-09 DIAGNOSIS — E039 Hypothyroidism, unspecified: Secondary | ICD-10-CM | POA: Diagnosis not present

## 2019-08-09 DIAGNOSIS — H6593 Unspecified nonsuppurative otitis media, bilateral: Secondary | ICD-10-CM | POA: Diagnosis not present

## 2019-08-09 DIAGNOSIS — Z1339 Encounter for screening examination for other mental health and behavioral disorders: Secondary | ICD-10-CM | POA: Diagnosis not present

## 2019-08-09 DIAGNOSIS — F419 Anxiety disorder, unspecified: Secondary | ICD-10-CM | POA: Diagnosis not present

## 2019-08-09 DIAGNOSIS — E1165 Type 2 diabetes mellitus with hyperglycemia: Secondary | ICD-10-CM | POA: Diagnosis not present

## 2019-08-09 DIAGNOSIS — I10 Essential (primary) hypertension: Secondary | ICD-10-CM | POA: Diagnosis not present

## 2019-10-31 DIAGNOSIS — E1165 Type 2 diabetes mellitus with hyperglycemia: Secondary | ICD-10-CM | POA: Diagnosis not present

## 2019-10-31 DIAGNOSIS — C73 Malignant neoplasm of thyroid gland: Secondary | ICD-10-CM | POA: Diagnosis not present

## 2019-10-31 DIAGNOSIS — E890 Postprocedural hypothyroidism: Secondary | ICD-10-CM | POA: Diagnosis not present

## 2019-10-31 DIAGNOSIS — I10 Essential (primary) hypertension: Secondary | ICD-10-CM | POA: Diagnosis not present

## 2019-12-07 DIAGNOSIS — Z79899 Other long term (current) drug therapy: Secondary | ICD-10-CM | POA: Diagnosis not present

## 2019-12-07 DIAGNOSIS — K089 Disorder of teeth and supporting structures, unspecified: Secondary | ICD-10-CM | POA: Diagnosis not present

## 2019-12-07 DIAGNOSIS — E1165 Type 2 diabetes mellitus with hyperglycemia: Secondary | ICD-10-CM | POA: Diagnosis not present

## 2019-12-07 DIAGNOSIS — I10 Essential (primary) hypertension: Secondary | ICD-10-CM | POA: Diagnosis not present

## 2019-12-07 DIAGNOSIS — K219 Gastro-esophageal reflux disease without esophagitis: Secondary | ICD-10-CM | POA: Diagnosis not present

## 2019-12-07 DIAGNOSIS — E039 Hypothyroidism, unspecified: Secondary | ICD-10-CM | POA: Diagnosis not present

## 2020-01-31 DIAGNOSIS — I10 Essential (primary) hypertension: Secondary | ICD-10-CM | POA: Diagnosis not present

## 2020-01-31 DIAGNOSIS — E1165 Type 2 diabetes mellitus with hyperglycemia: Secondary | ICD-10-CM | POA: Diagnosis not present

## 2020-01-31 DIAGNOSIS — C73 Malignant neoplasm of thyroid gland: Secondary | ICD-10-CM | POA: Diagnosis not present

## 2020-01-31 DIAGNOSIS — E890 Postprocedural hypothyroidism: Secondary | ICD-10-CM | POA: Diagnosis not present

## 2020-02-22 DIAGNOSIS — N390 Urinary tract infection, site not specified: Secondary | ICD-10-CM | POA: Diagnosis not present

## 2020-04-29 DIAGNOSIS — E559 Vitamin D deficiency, unspecified: Secondary | ICD-10-CM | POA: Diagnosis not present

## 2020-04-29 DIAGNOSIS — E039 Hypothyroidism, unspecified: Secondary | ICD-10-CM | POA: Diagnosis not present

## 2020-05-01 DIAGNOSIS — E890 Postprocedural hypothyroidism: Secondary | ICD-10-CM | POA: Diagnosis not present

## 2020-05-01 DIAGNOSIS — F450 Somatization disorder: Secondary | ICD-10-CM | POA: Diagnosis not present

## 2020-05-01 DIAGNOSIS — F09 Unspecified mental disorder due to known physiological condition: Secondary | ICD-10-CM | POA: Diagnosis not present

## 2020-05-01 DIAGNOSIS — F419 Anxiety disorder, unspecified: Secondary | ICD-10-CM | POA: Diagnosis not present

## 2020-05-01 DIAGNOSIS — E1165 Type 2 diabetes mellitus with hyperglycemia: Secondary | ICD-10-CM | POA: Diagnosis not present

## 2020-05-01 DIAGNOSIS — E559 Vitamin D deficiency, unspecified: Secondary | ICD-10-CM | POA: Diagnosis not present

## 2020-05-01 DIAGNOSIS — I10 Essential (primary) hypertension: Secondary | ICD-10-CM | POA: Diagnosis not present

## 2020-05-01 DIAGNOSIS — C73 Malignant neoplasm of thyroid gland: Secondary | ICD-10-CM | POA: Diagnosis not present

## 2020-08-02 DIAGNOSIS — M5136 Other intervertebral disc degeneration, lumbar region: Secondary | ICD-10-CM | POA: Diagnosis not present

## 2020-08-02 DIAGNOSIS — I10 Essential (primary) hypertension: Secondary | ICD-10-CM | POA: Diagnosis not present

## 2020-08-02 DIAGNOSIS — R911 Solitary pulmonary nodule: Secondary | ICD-10-CM | POA: Diagnosis not present

## 2020-08-02 DIAGNOSIS — M25561 Pain in right knee: Secondary | ICD-10-CM | POA: Diagnosis not present

## 2020-08-02 DIAGNOSIS — E890 Postprocedural hypothyroidism: Secondary | ICD-10-CM | POA: Diagnosis not present

## 2020-08-02 DIAGNOSIS — E1165 Type 2 diabetes mellitus with hyperglycemia: Secondary | ICD-10-CM | POA: Diagnosis not present

## 2020-08-02 DIAGNOSIS — E559 Vitamin D deficiency, unspecified: Secondary | ICD-10-CM | POA: Diagnosis not present

## 2020-08-02 DIAGNOSIS — Z79899 Other long term (current) drug therapy: Secondary | ICD-10-CM | POA: Diagnosis not present

## 2020-08-08 DIAGNOSIS — I10 Essential (primary) hypertension: Secondary | ICD-10-CM | POA: Diagnosis not present

## 2020-08-08 DIAGNOSIS — F09 Unspecified mental disorder due to known physiological condition: Secondary | ICD-10-CM | POA: Diagnosis not present

## 2020-08-08 DIAGNOSIS — F419 Anxiety disorder, unspecified: Secondary | ICD-10-CM | POA: Diagnosis not present

## 2020-08-08 DIAGNOSIS — E890 Postprocedural hypothyroidism: Secondary | ICD-10-CM | POA: Diagnosis not present

## 2020-08-08 DIAGNOSIS — E1165 Type 2 diabetes mellitus with hyperglycemia: Secondary | ICD-10-CM | POA: Diagnosis not present

## 2020-08-08 DIAGNOSIS — C73 Malignant neoplasm of thyroid gland: Secondary | ICD-10-CM | POA: Diagnosis not present

## 2020-08-08 DIAGNOSIS — E559 Vitamin D deficiency, unspecified: Secondary | ICD-10-CM | POA: Diagnosis not present

## 2020-08-08 DIAGNOSIS — F450 Somatization disorder: Secondary | ICD-10-CM | POA: Diagnosis not present

## 2020-08-09 DIAGNOSIS — M5136 Other intervertebral disc degeneration, lumbar region: Secondary | ICD-10-CM | POA: Diagnosis not present

## 2020-08-09 DIAGNOSIS — M545 Low back pain, unspecified: Secondary | ICD-10-CM | POA: Diagnosis not present

## 2020-09-05 DIAGNOSIS — M545 Low back pain, unspecified: Secondary | ICD-10-CM | POA: Diagnosis not present

## 2020-09-05 DIAGNOSIS — R3989 Other symptoms and signs involving the genitourinary system: Secondary | ICD-10-CM | POA: Diagnosis not present

## 2020-09-05 DIAGNOSIS — M549 Dorsalgia, unspecified: Secondary | ICD-10-CM | POA: Diagnosis not present

## 2020-09-05 DIAGNOSIS — R1011 Right upper quadrant pain: Secondary | ICD-10-CM | POA: Diagnosis not present

## 2020-09-12 DIAGNOSIS — Z853 Personal history of malignant neoplasm of breast: Secondary | ICD-10-CM | POA: Diagnosis not present

## 2020-09-12 DIAGNOSIS — M40204 Unspecified kyphosis, thoracic region: Secondary | ICD-10-CM | POA: Diagnosis not present

## 2020-09-12 DIAGNOSIS — M5134 Other intervertebral disc degeneration, thoracic region: Secondary | ICD-10-CM | POA: Diagnosis not present

## 2020-11-05 DIAGNOSIS — L821 Other seborrheic keratosis: Secondary | ICD-10-CM | POA: Diagnosis not present

## 2020-11-05 DIAGNOSIS — L578 Other skin changes due to chronic exposure to nonionizing radiation: Secondary | ICD-10-CM | POA: Diagnosis not present

## 2020-11-05 DIAGNOSIS — L820 Inflamed seborrheic keratosis: Secondary | ICD-10-CM | POA: Diagnosis not present

## 2020-11-06 DIAGNOSIS — E039 Hypothyroidism, unspecified: Secondary | ICD-10-CM | POA: Diagnosis not present

## 2020-11-18 DIAGNOSIS — Z794 Long term (current) use of insulin: Secondary | ICD-10-CM | POA: Diagnosis not present

## 2020-11-18 DIAGNOSIS — I10 Essential (primary) hypertension: Secondary | ICD-10-CM | POA: Diagnosis not present

## 2020-11-18 DIAGNOSIS — H524 Presbyopia: Secondary | ICD-10-CM | POA: Diagnosis not present

## 2020-11-18 DIAGNOSIS — H52223 Regular astigmatism, bilateral: Secondary | ICD-10-CM | POA: Diagnosis not present

## 2020-11-18 DIAGNOSIS — H35353 Cystoid macular degeneration, bilateral: Secondary | ICD-10-CM | POA: Diagnosis not present

## 2020-11-18 DIAGNOSIS — H35371 Puckering of macula, right eye: Secondary | ICD-10-CM | POA: Diagnosis not present

## 2020-11-18 DIAGNOSIS — H25813 Combined forms of age-related cataract, bilateral: Secondary | ICD-10-CM | POA: Diagnosis not present

## 2020-11-18 DIAGNOSIS — E119 Type 2 diabetes mellitus without complications: Secondary | ICD-10-CM | POA: Diagnosis not present

## 2020-11-18 DIAGNOSIS — H5203 Hypermetropia, bilateral: Secondary | ICD-10-CM | POA: Diagnosis not present

## 2020-11-25 DIAGNOSIS — E890 Postprocedural hypothyroidism: Secondary | ICD-10-CM | POA: Diagnosis not present

## 2020-11-25 DIAGNOSIS — C73 Malignant neoplasm of thyroid gland: Secondary | ICD-10-CM | POA: Diagnosis not present

## 2020-11-25 DIAGNOSIS — E559 Vitamin D deficiency, unspecified: Secondary | ICD-10-CM | POA: Diagnosis not present

## 2020-12-13 ENCOUNTER — Telehealth: Payer: Self-pay | Admitting: Gastroenterology

## 2020-12-13 NOTE — Telephone Encounter (Signed)
Inbound call from pt requesting a call back stating that she is having some bowel blockage and wants to know what she can take for it. Please advise. Thank you.

## 2020-12-13 NOTE — Telephone Encounter (Signed)
Pt states that  she feels as though she could have a bowel blockage. Pt states that her last BM was on the 26th  2 days ago. Pt was instructed that she could try a miralx purge, Pt agreed. Instructions provided.Notified pt that is she does not feel better on Monday please give [place] a call back for further advise. Pt verbalized understanding with all questions answered.

## 2020-12-17 DIAGNOSIS — H539 Unspecified visual disturbance: Secondary | ICD-10-CM | POA: Diagnosis not present

## 2020-12-17 DIAGNOSIS — E2839 Other primary ovarian failure: Secondary | ICD-10-CM | POA: Diagnosis not present

## 2020-12-17 DIAGNOSIS — Z1339 Encounter for screening examination for other mental health and behavioral disorders: Secondary | ICD-10-CM | POA: Diagnosis not present

## 2020-12-17 DIAGNOSIS — Z1331 Encounter for screening for depression: Secondary | ICD-10-CM | POA: Diagnosis not present

## 2020-12-17 DIAGNOSIS — H35353 Cystoid macular degeneration, bilateral: Secondary | ICD-10-CM | POA: Diagnosis not present

## 2020-12-17 DIAGNOSIS — Z0001 Encounter for general adult medical examination with abnormal findings: Secondary | ICD-10-CM | POA: Diagnosis not present

## 2020-12-17 DIAGNOSIS — Z7189 Other specified counseling: Secondary | ICD-10-CM | POA: Diagnosis not present

## 2020-12-17 DIAGNOSIS — H35371 Puckering of macula, right eye: Secondary | ICD-10-CM | POA: Diagnosis not present

## 2020-12-17 DIAGNOSIS — Z79899 Other long term (current) drug therapy: Secondary | ICD-10-CM | POA: Diagnosis not present

## 2021-01-29 DIAGNOSIS — Z1231 Encounter for screening mammogram for malignant neoplasm of breast: Secondary | ICD-10-CM | POA: Diagnosis not present

## 2021-02-11 DIAGNOSIS — I10 Essential (primary) hypertension: Secondary | ICD-10-CM | POA: Diagnosis not present

## 2021-02-11 DIAGNOSIS — Z79899 Other long term (current) drug therapy: Secondary | ICD-10-CM | POA: Diagnosis not present

## 2021-02-11 DIAGNOSIS — E039 Hypothyroidism, unspecified: Secondary | ICD-10-CM | POA: Diagnosis not present

## 2021-02-11 DIAGNOSIS — E1165 Type 2 diabetes mellitus with hyperglycemia: Secondary | ICD-10-CM | POA: Diagnosis not present

## 2021-03-07 DIAGNOSIS — M5441 Lumbago with sciatica, right side: Secondary | ICD-10-CM | POA: Diagnosis not present

## 2021-03-07 DIAGNOSIS — G8929 Other chronic pain: Secondary | ICD-10-CM | POA: Diagnosis not present

## 2021-03-07 DIAGNOSIS — M5442 Lumbago with sciatica, left side: Secondary | ICD-10-CM | POA: Diagnosis not present

## 2021-03-07 DIAGNOSIS — M47816 Spondylosis without myelopathy or radiculopathy, lumbar region: Secondary | ICD-10-CM | POA: Diagnosis not present

## 2021-03-07 DIAGNOSIS — M4316 Spondylolisthesis, lumbar region: Secondary | ICD-10-CM | POA: Diagnosis not present

## 2021-03-09 IMAGING — CT CT ABD-PELV W/O CM
2 of 4 series · 16 of 46 positions shown, 18 images · non-contrast
Comparison: 12/14/2017 CT abdomen. 07/09/2017 CT abdomen/pelvis.

CLINICAL DATA: Upper abdominal and periumbilical pain. Prior
cholecystectomy, hysterectomy and appendectomy.

EXAM:
CT ABDOMEN AND PELVIS WITHOUT CONTRAST
TECHNIQUE: Multidetector CT imaging of the abdomen and pelvis was performed
following the standard protocol without IV contrast.

[Series 2: axial st · axial · 0.88mm/px · z∈[-500,-80]mm · 13 of 94 slices shown, 15 images]
[im 5/94  soft-tissue]
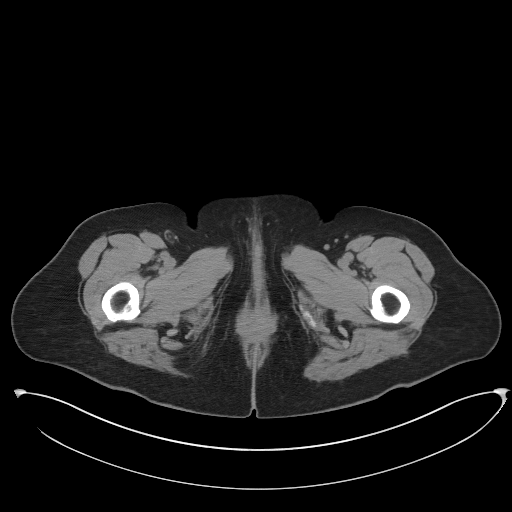
[im 5/94  bone]
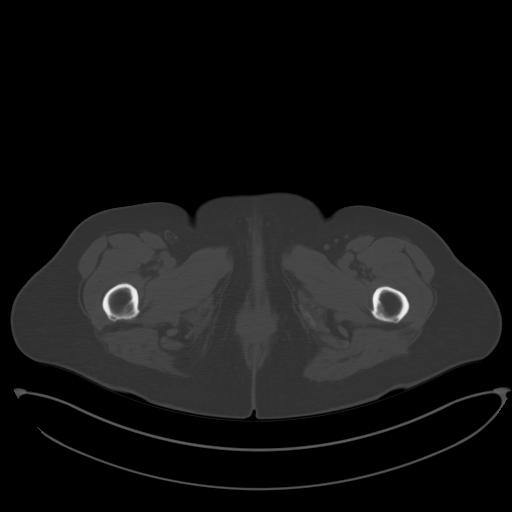
[im 13/94  soft-tissue]
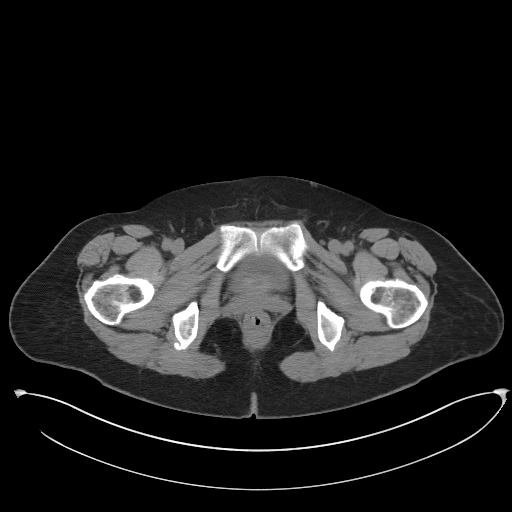
[im 21/94  soft-tissue]
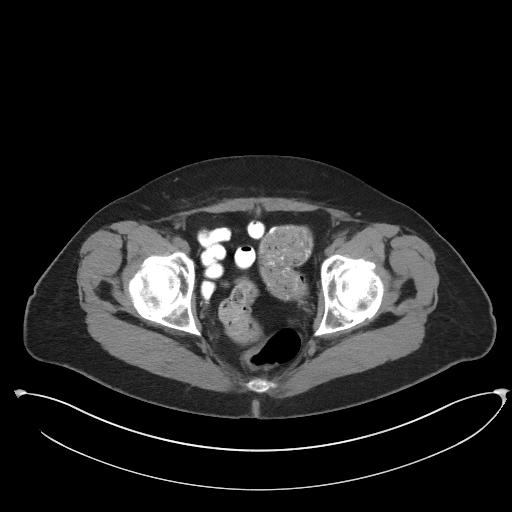
[im 25/94  soft-tissue]
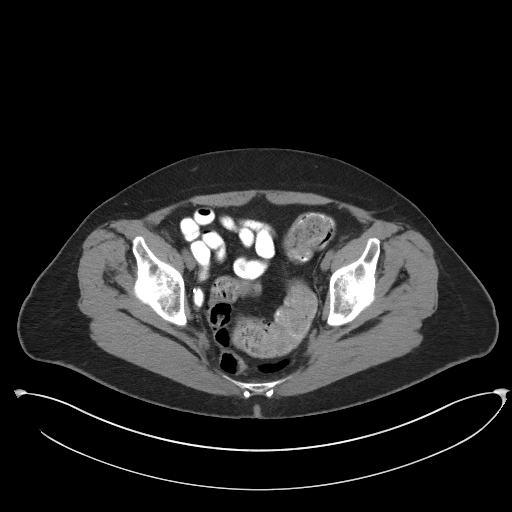
[im 33/94  soft-tissue]
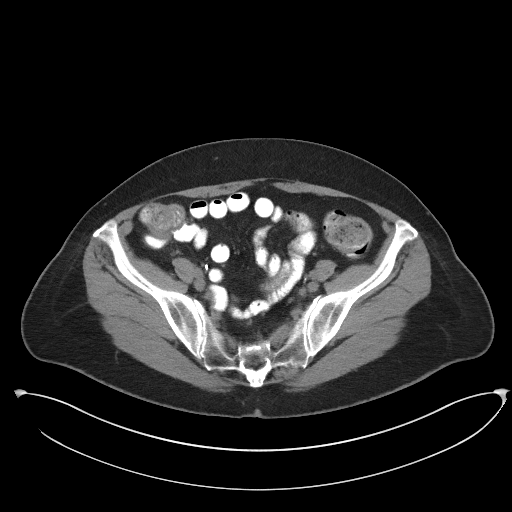
[im 41/94  soft-tissue]
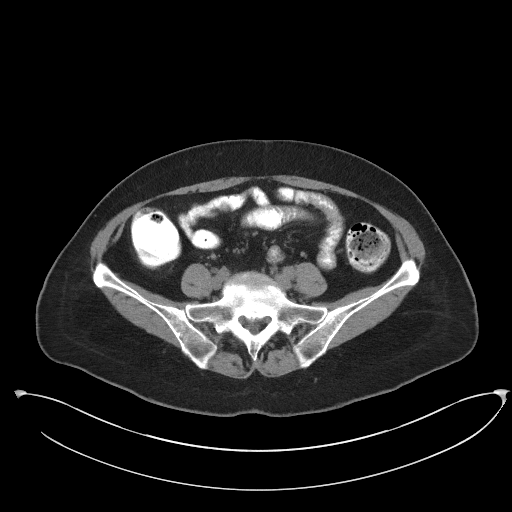
[im 49/94  soft-tissue]
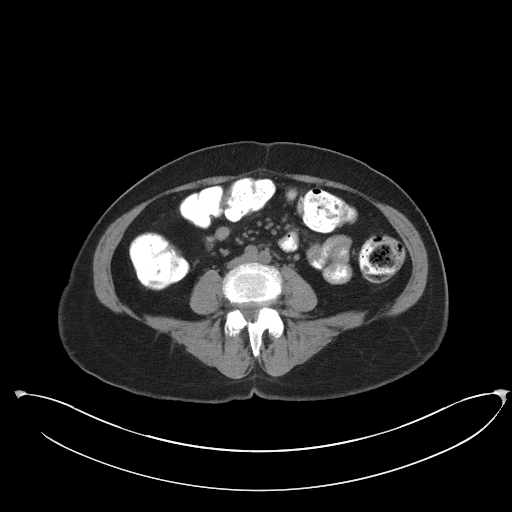
[im 53/94  soft-tissue]
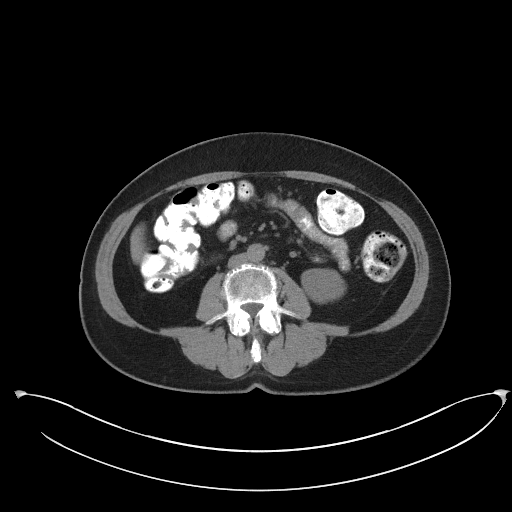
[im 61/94  soft-tissue]
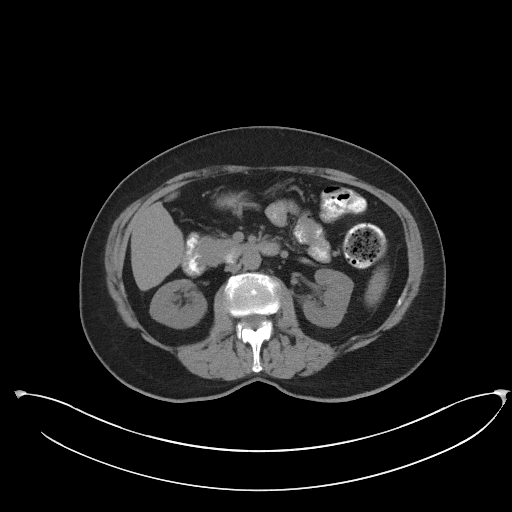
[im 61/94  bone]
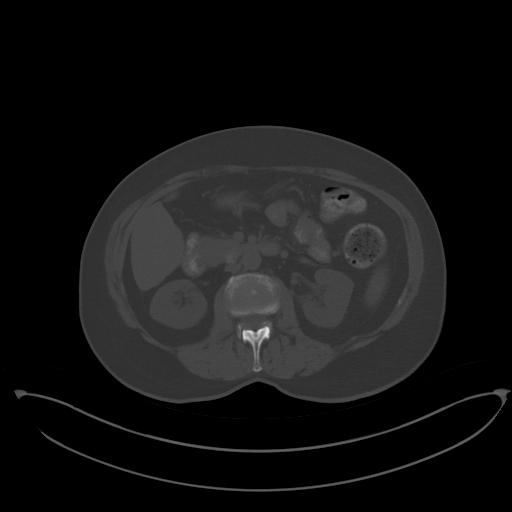
[im 69/94  soft-tissue]
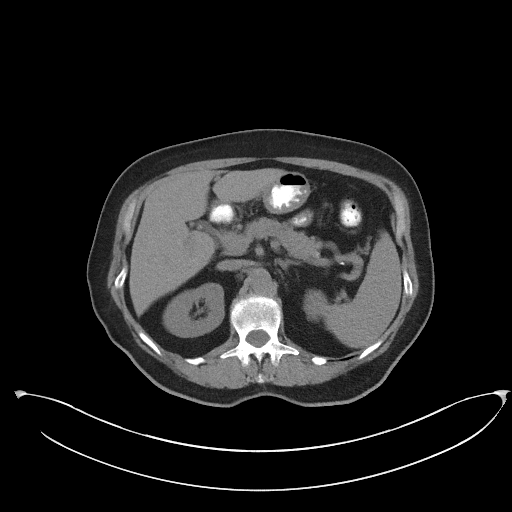
[im 73/94  soft-tissue]
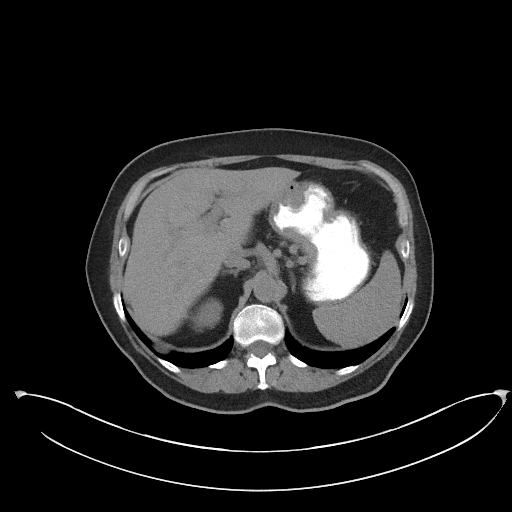
[im 81/94  soft-tissue]
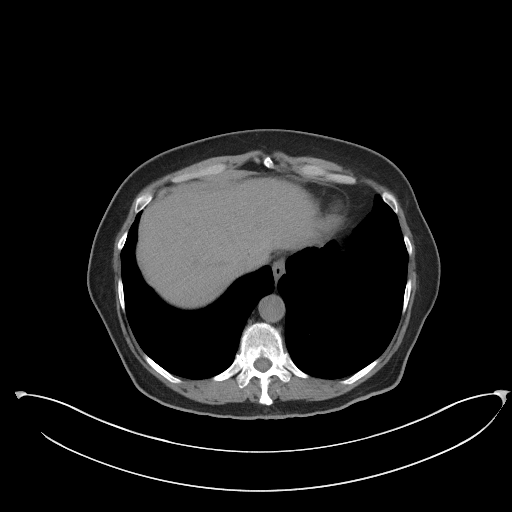
[im 89/94  soft-tissue]
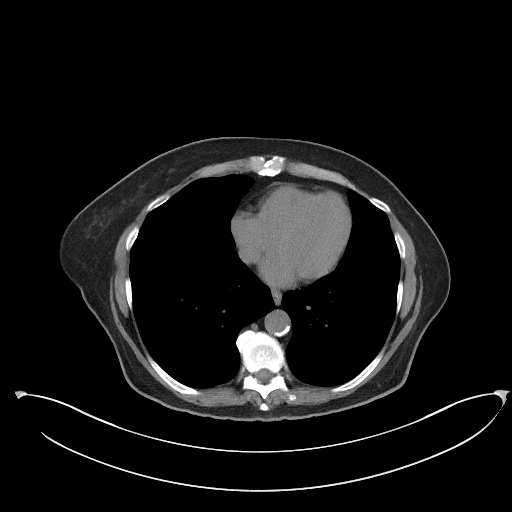

[Series 5: coronal st · coronal · 0.72mm/px · 3 of 89 slices shown]
[im 30/89  soft-tissue]
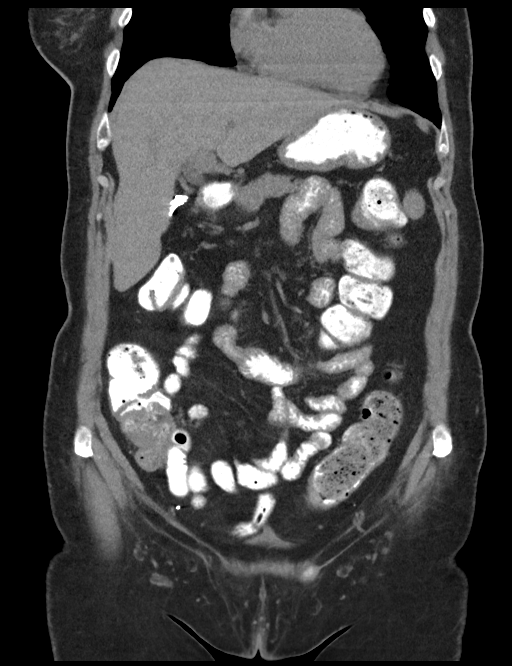
[im 40/89  soft-tissue]
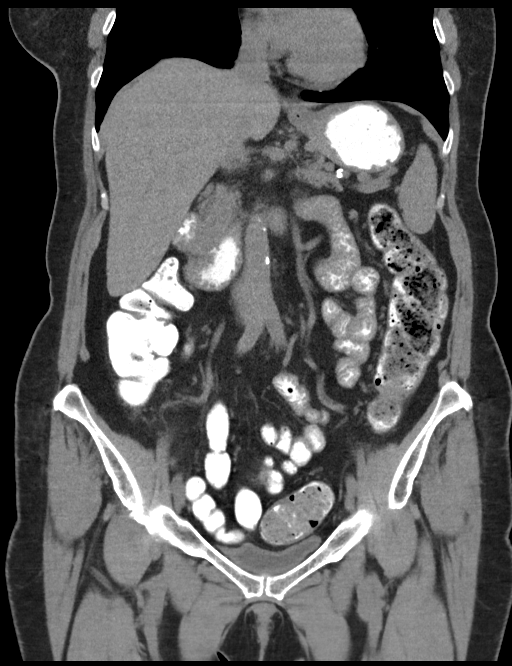
[im 49/89  soft-tissue]
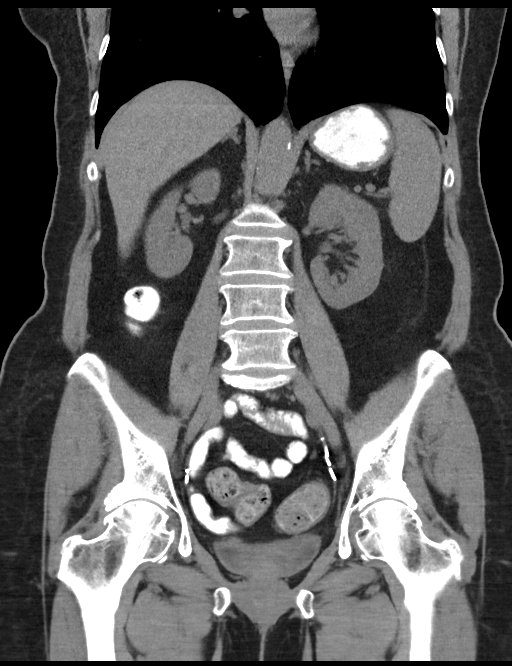

[16 of 46 positions shown; findings below may reference images not displayed]

FINDINGS: Lower chest: A few scattered small solid pulmonary nodules at the
right lung base, largest 4 mm in the medial right lower lobe (series
4/image 8), all stable since 07/09/2017 chest CT, considered benign.
No acute abnormality at the lung bases.

Hepatobiliary: Normal liver size. Subcentimeter hypodense posterior
right liver lesion is too small to characterize and is unchanged,
considered benign. No new liver lesions. Cholecystectomy. No biliary
ductal dilatation.

Pancreas: Normal, with no mass or duct dilation.

Spleen: Normal size. No mass.

Adrenals/Urinary Tract: Normal adrenals. No renal stones. No
hydronephrosis. No contour deforming renal masses. Normal caliber
ureters. No ureteral stones. Normal bladder. No bladder stones.

Stomach/Bowel: Normal non-distended stomach. Normal caliber small
bowel with no small bowel wall thickening. Appendectomy. Moderate
colonic stool volume. Oral contrast transits to the distal colon. No
large bowel wall thickening, significant diverticulosis or acute
pericolonic fat stranding.

Vascular/Lymphatic: Atherosclerotic nonaneurysmal abdominal aorta.
No pathologically enlarged lymph nodes in the abdomen or pelvis.

Reproductive: Status post hysterectomy, with no abnormal findings at
the vaginal cuff. No adnexal mass.

Other: No pneumoperitoneum, ascites or focal fluid collection.

Musculoskeletal: No aggressive appearing focal osseous lesions.
Mild-to-moderate thoracolumbar spondylosis.
IMPRESSION: No acute abnormality. No evidence of bowel obstruction or acute
bowel inflammation. Moderate colonic stool volume, which may
indicate constipation.

Aortic Atherosclerosis (BH73C-3IL.L).

## 2021-03-12 DIAGNOSIS — M5416 Radiculopathy, lumbar region: Secondary | ICD-10-CM | POA: Diagnosis not present

## 2021-03-12 DIAGNOSIS — M5442 Lumbago with sciatica, left side: Secondary | ICD-10-CM | POA: Diagnosis not present

## 2021-03-12 DIAGNOSIS — M545 Low back pain, unspecified: Secondary | ICD-10-CM | POA: Diagnosis not present

## 2021-03-12 DIAGNOSIS — M5459 Other low back pain: Secondary | ICD-10-CM | POA: Diagnosis not present

## 2021-03-12 DIAGNOSIS — M4316 Spondylolisthesis, lumbar region: Secondary | ICD-10-CM | POA: Diagnosis not present

## 2021-03-12 DIAGNOSIS — M6281 Muscle weakness (generalized): Secondary | ICD-10-CM | POA: Diagnosis not present

## 2021-03-14 DIAGNOSIS — M6281 Muscle weakness (generalized): Secondary | ICD-10-CM | POA: Diagnosis not present

## 2021-03-14 DIAGNOSIS — M5459 Other low back pain: Secondary | ICD-10-CM | POA: Diagnosis not present

## 2021-03-14 DIAGNOSIS — M5416 Radiculopathy, lumbar region: Secondary | ICD-10-CM | POA: Diagnosis not present

## 2021-03-14 DIAGNOSIS — M4316 Spondylolisthesis, lumbar region: Secondary | ICD-10-CM | POA: Diagnosis not present

## 2021-03-15 DIAGNOSIS — F419 Anxiety disorder, unspecified: Secondary | ICD-10-CM | POA: Diagnosis not present

## 2021-03-15 DIAGNOSIS — C73 Malignant neoplasm of thyroid gland: Secondary | ICD-10-CM | POA: Diagnosis not present

## 2021-03-15 DIAGNOSIS — I10 Essential (primary) hypertension: Secondary | ICD-10-CM | POA: Diagnosis not present

## 2021-03-15 DIAGNOSIS — F450 Somatization disorder: Secondary | ICD-10-CM | POA: Diagnosis not present

## 2021-03-15 DIAGNOSIS — E890 Postprocedural hypothyroidism: Secondary | ICD-10-CM | POA: Diagnosis not present

## 2021-03-15 DIAGNOSIS — E559 Vitamin D deficiency, unspecified: Secondary | ICD-10-CM | POA: Diagnosis not present

## 2021-03-15 DIAGNOSIS — E1165 Type 2 diabetes mellitus with hyperglycemia: Secondary | ICD-10-CM | POA: Diagnosis not present

## 2021-03-15 DIAGNOSIS — F09 Unspecified mental disorder due to known physiological condition: Secondary | ICD-10-CM | POA: Diagnosis not present

## 2021-03-15 DIAGNOSIS — M797 Fibromyalgia: Secondary | ICD-10-CM | POA: Diagnosis not present

## 2021-03-18 DIAGNOSIS — M5416 Radiculopathy, lumbar region: Secondary | ICD-10-CM | POA: Diagnosis not present

## 2021-03-18 DIAGNOSIS — M6281 Muscle weakness (generalized): Secondary | ICD-10-CM | POA: Diagnosis not present

## 2021-03-18 DIAGNOSIS — M4316 Spondylolisthesis, lumbar region: Secondary | ICD-10-CM | POA: Diagnosis not present

## 2021-03-18 DIAGNOSIS — M5459 Other low back pain: Secondary | ICD-10-CM | POA: Diagnosis not present

## 2021-03-20 DIAGNOSIS — M5459 Other low back pain: Secondary | ICD-10-CM | POA: Diagnosis not present

## 2021-03-20 DIAGNOSIS — M5416 Radiculopathy, lumbar region: Secondary | ICD-10-CM | POA: Diagnosis not present

## 2021-03-20 DIAGNOSIS — M6281 Muscle weakness (generalized): Secondary | ICD-10-CM | POA: Diagnosis not present

## 2021-03-20 DIAGNOSIS — M4316 Spondylolisthesis, lumbar region: Secondary | ICD-10-CM | POA: Diagnosis not present

## 2021-03-25 DIAGNOSIS — M5459 Other low back pain: Secondary | ICD-10-CM | POA: Diagnosis not present

## 2021-03-25 DIAGNOSIS — M6281 Muscle weakness (generalized): Secondary | ICD-10-CM | POA: Diagnosis not present

## 2021-03-25 DIAGNOSIS — M5416 Radiculopathy, lumbar region: Secondary | ICD-10-CM | POA: Diagnosis not present

## 2021-03-25 DIAGNOSIS — M4316 Spondylolisthesis, lumbar region: Secondary | ICD-10-CM | POA: Diagnosis not present

## 2021-03-26 DIAGNOSIS — M810 Age-related osteoporosis without current pathological fracture: Secondary | ICD-10-CM | POA: Diagnosis not present

## 2021-03-26 DIAGNOSIS — E2839 Other primary ovarian failure: Secondary | ICD-10-CM | POA: Diagnosis not present

## 2021-03-26 DIAGNOSIS — M85851 Other specified disorders of bone density and structure, right thigh: Secondary | ICD-10-CM | POA: Diagnosis not present

## 2021-03-27 DIAGNOSIS — M5459 Other low back pain: Secondary | ICD-10-CM | POA: Diagnosis not present

## 2021-03-27 DIAGNOSIS — M5416 Radiculopathy, lumbar region: Secondary | ICD-10-CM | POA: Diagnosis not present

## 2021-03-27 DIAGNOSIS — M4316 Spondylolisthesis, lumbar region: Secondary | ICD-10-CM | POA: Diagnosis not present

## 2021-03-27 DIAGNOSIS — M6281 Muscle weakness (generalized): Secondary | ICD-10-CM | POA: Diagnosis not present

## 2021-04-02 DIAGNOSIS — R0981 Nasal congestion: Secondary | ICD-10-CM | POA: Diagnosis not present

## 2021-04-08 DIAGNOSIS — M4316 Spondylolisthesis, lumbar region: Secondary | ICD-10-CM | POA: Diagnosis not present

## 2021-04-08 DIAGNOSIS — M6281 Muscle weakness (generalized): Secondary | ICD-10-CM | POA: Diagnosis not present

## 2021-04-08 DIAGNOSIS — M5416 Radiculopathy, lumbar region: Secondary | ICD-10-CM | POA: Diagnosis not present

## 2021-04-08 DIAGNOSIS — M5459 Other low back pain: Secondary | ICD-10-CM | POA: Diagnosis not present

## 2021-04-10 DIAGNOSIS — M5416 Radiculopathy, lumbar region: Secondary | ICD-10-CM | POA: Diagnosis not present

## 2021-04-10 DIAGNOSIS — M5459 Other low back pain: Secondary | ICD-10-CM | POA: Diagnosis not present

## 2021-04-10 DIAGNOSIS — M6281 Muscle weakness (generalized): Secondary | ICD-10-CM | POA: Diagnosis not present

## 2021-04-10 DIAGNOSIS — M4316 Spondylolisthesis, lumbar region: Secondary | ICD-10-CM | POA: Diagnosis not present

## 2021-04-17 DIAGNOSIS — E890 Postprocedural hypothyroidism: Secondary | ICD-10-CM | POA: Diagnosis not present

## 2021-04-17 DIAGNOSIS — I10 Essential (primary) hypertension: Secondary | ICD-10-CM | POA: Diagnosis not present

## 2021-04-23 DIAGNOSIS — M4316 Spondylolisthesis, lumbar region: Secondary | ICD-10-CM | POA: Diagnosis not present

## 2021-04-23 DIAGNOSIS — M47816 Spondylosis without myelopathy or radiculopathy, lumbar region: Secondary | ICD-10-CM | POA: Diagnosis not present

## 2021-04-23 DIAGNOSIS — M5442 Lumbago with sciatica, left side: Secondary | ICD-10-CM | POA: Diagnosis not present

## 2021-04-28 DIAGNOSIS — Z6825 Body mass index (BMI) 25.0-25.9, adult: Secondary | ICD-10-CM | POA: Diagnosis not present

## 2021-04-28 DIAGNOSIS — R112 Nausea with vomiting, unspecified: Secondary | ICD-10-CM | POA: Diagnosis not present

## 2021-04-28 DIAGNOSIS — K5900 Constipation, unspecified: Secondary | ICD-10-CM | POA: Diagnosis not present

## 2021-05-01 DIAGNOSIS — Z6825 Body mass index (BMI) 25.0-25.9, adult: Secondary | ICD-10-CM | POA: Diagnosis not present

## 2021-05-01 DIAGNOSIS — I10 Essential (primary) hypertension: Secondary | ICD-10-CM | POA: Diagnosis not present

## 2021-05-15 DIAGNOSIS — Z6825 Body mass index (BMI) 25.0-25.9, adult: Secondary | ICD-10-CM | POA: Diagnosis not present

## 2021-05-15 DIAGNOSIS — I10 Essential (primary) hypertension: Secondary | ICD-10-CM | POA: Diagnosis not present

## 2021-05-27 DIAGNOSIS — I10 Essential (primary) hypertension: Secondary | ICD-10-CM | POA: Diagnosis not present

## 2021-06-17 DIAGNOSIS — I10 Essential (primary) hypertension: Secondary | ICD-10-CM | POA: Diagnosis not present

## 2021-06-17 DIAGNOSIS — Z6825 Body mass index (BMI) 25.0-25.9, adult: Secondary | ICD-10-CM | POA: Diagnosis not present

## 2021-06-18 DIAGNOSIS — E559 Vitamin D deficiency, unspecified: Secondary | ICD-10-CM | POA: Diagnosis not present

## 2021-06-18 DIAGNOSIS — F09 Unspecified mental disorder due to known physiological condition: Secondary | ICD-10-CM | POA: Diagnosis not present

## 2021-06-18 DIAGNOSIS — F450 Somatization disorder: Secondary | ICD-10-CM | POA: Diagnosis not present

## 2021-06-18 DIAGNOSIS — E1165 Type 2 diabetes mellitus with hyperglycemia: Secondary | ICD-10-CM | POA: Diagnosis not present

## 2021-06-18 DIAGNOSIS — I10 Essential (primary) hypertension: Secondary | ICD-10-CM | POA: Diagnosis not present

## 2021-06-18 DIAGNOSIS — M797 Fibromyalgia: Secondary | ICD-10-CM | POA: Diagnosis not present

## 2021-06-18 DIAGNOSIS — E890 Postprocedural hypothyroidism: Secondary | ICD-10-CM | POA: Diagnosis not present

## 2021-06-18 DIAGNOSIS — C73 Malignant neoplasm of thyroid gland: Secondary | ICD-10-CM | POA: Diagnosis not present

## 2021-06-18 DIAGNOSIS — F419 Anxiety disorder, unspecified: Secondary | ICD-10-CM | POA: Diagnosis not present

## 2021-08-12 DIAGNOSIS — E1165 Type 2 diabetes mellitus with hyperglycemia: Secondary | ICD-10-CM | POA: Diagnosis not present

## 2021-08-12 DIAGNOSIS — Z794 Long term (current) use of insulin: Secondary | ICD-10-CM | POA: Diagnosis not present

## 2021-08-12 DIAGNOSIS — R251 Tremor, unspecified: Secondary | ICD-10-CM | POA: Diagnosis not present

## 2021-08-12 DIAGNOSIS — Z6826 Body mass index (BMI) 26.0-26.9, adult: Secondary | ICD-10-CM | POA: Diagnosis not present

## 2021-08-12 DIAGNOSIS — E890 Postprocedural hypothyroidism: Secondary | ICD-10-CM | POA: Diagnosis not present

## 2021-08-12 DIAGNOSIS — I10 Essential (primary) hypertension: Secondary | ICD-10-CM | POA: Diagnosis not present

## 2021-08-21 DIAGNOSIS — E1169 Type 2 diabetes mellitus with other specified complication: Secondary | ICD-10-CM | POA: Diagnosis not present

## 2021-08-21 DIAGNOSIS — E1165 Type 2 diabetes mellitus with hyperglycemia: Secondary | ICD-10-CM | POA: Diagnosis not present

## 2021-08-21 DIAGNOSIS — Z6825 Body mass index (BMI) 25.0-25.9, adult: Secondary | ICD-10-CM | POA: Diagnosis not present

## 2021-09-18 DIAGNOSIS — F450 Somatization disorder: Secondary | ICD-10-CM | POA: Diagnosis not present

## 2021-09-18 DIAGNOSIS — I10 Essential (primary) hypertension: Secondary | ICD-10-CM | POA: Diagnosis not present

## 2021-09-18 DIAGNOSIS — E559 Vitamin D deficiency, unspecified: Secondary | ICD-10-CM | POA: Diagnosis not present

## 2021-09-18 DIAGNOSIS — E039 Hypothyroidism, unspecified: Secondary | ICD-10-CM | POA: Diagnosis not present

## 2021-09-18 DIAGNOSIS — E890 Postprocedural hypothyroidism: Secondary | ICD-10-CM | POA: Diagnosis not present

## 2021-09-18 DIAGNOSIS — F411 Generalized anxiety disorder: Secondary | ICD-10-CM | POA: Diagnosis not present

## 2021-09-18 DIAGNOSIS — E1165 Type 2 diabetes mellitus with hyperglycemia: Secondary | ICD-10-CM | POA: Diagnosis not present

## 2021-09-18 DIAGNOSIS — C73 Malignant neoplasm of thyroid gland: Secondary | ICD-10-CM | POA: Diagnosis not present

## 2021-09-18 DIAGNOSIS — F09 Unspecified mental disorder due to known physiological condition: Secondary | ICD-10-CM | POA: Diagnosis not present

## 2021-10-02 DIAGNOSIS — F32A Depression, unspecified: Secondary | ICD-10-CM | POA: Diagnosis not present

## 2021-10-02 DIAGNOSIS — Z79899 Other long term (current) drug therapy: Secondary | ICD-10-CM | POA: Diagnosis not present

## 2021-10-02 DIAGNOSIS — I10 Essential (primary) hypertension: Secondary | ICD-10-CM | POA: Diagnosis not present

## 2021-10-02 DIAGNOSIS — R079 Chest pain, unspecified: Secondary | ICD-10-CM | POA: Diagnosis not present

## 2021-10-02 DIAGNOSIS — E1165 Type 2 diabetes mellitus with hyperglycemia: Secondary | ICD-10-CM | POA: Diagnosis not present

## 2021-10-02 DIAGNOSIS — K219 Gastro-esophageal reflux disease without esophagitis: Secondary | ICD-10-CM | POA: Diagnosis not present

## 2021-10-02 DIAGNOSIS — E890 Postprocedural hypothyroidism: Secondary | ICD-10-CM | POA: Diagnosis not present

## 2021-10-02 DIAGNOSIS — R001 Bradycardia, unspecified: Secondary | ICD-10-CM | POA: Diagnosis not present

## 2021-10-02 DIAGNOSIS — Z794 Long term (current) use of insulin: Secondary | ICD-10-CM | POA: Diagnosis not present

## 2021-10-02 DIAGNOSIS — R251 Tremor, unspecified: Secondary | ICD-10-CM | POA: Diagnosis not present

## 2021-10-02 DIAGNOSIS — Z853 Personal history of malignant neoplasm of breast: Secondary | ICD-10-CM | POA: Diagnosis not present

## 2021-11-25 DIAGNOSIS — N898 Other specified noninflammatory disorders of vagina: Secondary | ICD-10-CM | POA: Diagnosis not present

## 2021-11-25 DIAGNOSIS — R109 Unspecified abdominal pain: Secondary | ICD-10-CM | POA: Diagnosis not present

## 2021-11-25 DIAGNOSIS — E663 Overweight: Secondary | ICD-10-CM | POA: Diagnosis not present

## 2021-11-25 DIAGNOSIS — R0781 Pleurodynia: Secondary | ICD-10-CM | POA: Diagnosis not present

## 2021-11-25 DIAGNOSIS — I10 Essential (primary) hypertension: Secondary | ICD-10-CM | POA: Diagnosis not present

## 2021-11-25 DIAGNOSIS — R300 Dysuria: Secondary | ICD-10-CM | POA: Diagnosis not present

## 2021-11-25 DIAGNOSIS — R1011 Right upper quadrant pain: Secondary | ICD-10-CM | POA: Diagnosis not present

## 2021-12-04 DIAGNOSIS — R29898 Other symptoms and signs involving the musculoskeletal system: Secondary | ICD-10-CM | POA: Diagnosis not present

## 2021-12-04 DIAGNOSIS — R251 Tremor, unspecified: Secondary | ICD-10-CM | POA: Diagnosis not present

## 2021-12-23 DIAGNOSIS — I10 Essential (primary) hypertension: Secondary | ICD-10-CM | POA: Diagnosis not present

## 2021-12-23 DIAGNOSIS — E1165 Type 2 diabetes mellitus with hyperglycemia: Secondary | ICD-10-CM | POA: Diagnosis not present

## 2021-12-23 DIAGNOSIS — M797 Fibromyalgia: Secondary | ICD-10-CM | POA: Diagnosis not present

## 2021-12-23 DIAGNOSIS — Z8719 Personal history of other diseases of the digestive system: Secondary | ICD-10-CM | POA: Diagnosis not present

## 2021-12-23 DIAGNOSIS — E890 Postprocedural hypothyroidism: Secondary | ICD-10-CM | POA: Diagnosis not present

## 2021-12-23 DIAGNOSIS — E039 Hypothyroidism, unspecified: Secondary | ICD-10-CM | POA: Diagnosis not present

## 2021-12-23 DIAGNOSIS — E559 Vitamin D deficiency, unspecified: Secondary | ICD-10-CM | POA: Diagnosis not present

## 2021-12-30 DIAGNOSIS — Z794 Long term (current) use of insulin: Secondary | ICD-10-CM | POA: Diagnosis not present

## 2021-12-30 DIAGNOSIS — H04123 Dry eye syndrome of bilateral lacrimal glands: Secondary | ICD-10-CM | POA: Diagnosis not present

## 2021-12-30 DIAGNOSIS — E119 Type 2 diabetes mellitus without complications: Secondary | ICD-10-CM | POA: Diagnosis not present

## 2021-12-30 DIAGNOSIS — Z7984 Long term (current) use of oral hypoglycemic drugs: Secondary | ICD-10-CM | POA: Diagnosis not present

## 2021-12-30 DIAGNOSIS — H5203 Hypermetropia, bilateral: Secondary | ICD-10-CM | POA: Diagnosis not present

## 2021-12-30 DIAGNOSIS — H59353 Postprocedural seroma of eye and adnexa following an ophthalmic procedure, bilateral: Secondary | ICD-10-CM | POA: Diagnosis not present

## 2021-12-30 DIAGNOSIS — H52223 Regular astigmatism, bilateral: Secondary | ICD-10-CM | POA: Diagnosis not present

## 2021-12-30 DIAGNOSIS — H25813 Combined forms of age-related cataract, bilateral: Secondary | ICD-10-CM | POA: Diagnosis not present

## 2021-12-30 DIAGNOSIS — H524 Presbyopia: Secondary | ICD-10-CM | POA: Diagnosis not present

## 2021-12-30 DIAGNOSIS — H35353 Cystoid macular degeneration, bilateral: Secondary | ICD-10-CM | POA: Diagnosis not present

## 2022-01-01 DIAGNOSIS — E782 Mixed hyperlipidemia: Secondary | ICD-10-CM | POA: Diagnosis not present

## 2022-01-01 DIAGNOSIS — I10 Essential (primary) hypertension: Secondary | ICD-10-CM | POA: Diagnosis not present

## 2022-01-27 ENCOUNTER — Encounter: Payer: Self-pay | Admitting: Gastroenterology

## 2022-01-27 ENCOUNTER — Ambulatory Visit (INDEPENDENT_AMBULATORY_CARE_PROVIDER_SITE_OTHER): Payer: Medicare Other | Admitting: Gastroenterology

## 2022-01-27 VITALS — BP 142/88 | HR 69 | Ht 65.0 in | Wt 162.0 lb

## 2022-01-27 DIAGNOSIS — Z86010 Personal history of colonic polyps: Secondary | ICD-10-CM | POA: Diagnosis not present

## 2022-01-27 DIAGNOSIS — K581 Irritable bowel syndrome with constipation: Secondary | ICD-10-CM | POA: Diagnosis not present

## 2022-01-27 DIAGNOSIS — R1011 Right upper quadrant pain: Secondary | ICD-10-CM | POA: Diagnosis not present

## 2022-01-27 MED ORDER — POLYETHYLENE GLYCOL 3350 17 GM/SCOOP PO POWD: 17.0000 g | Freq: Every day | ORAL | 3 refills | Status: DC
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
## 2022-01-27 NOTE — Patient Instructions (Addendum)
_______________________________________________________  If you are age 76 or older, your body mass index should be between 23-30. Your Body mass index is 26.96 kg/m. If this is out of the aforementioned range listed, please consider follow up with your Primary Care Provider.  If you are age 57 or younger, your body mass index should be between 19-25. Your Body mass index is 26.96 kg/m. If this is out of the aformentioned range listed, please consider follow up with your Primary Care Provider.   ________________________________________________________  The [facility] providers would like to encourage you to use [facility] to communicate with providers for non-urgent requests or questions.  Due to long hold times on the telephone, sending your provider a message by [facility] may be a faster and more efficient way to get a response.  Please allow 48 business hours for a response.  Please remember that this is for non-urgent requests.  _______________________________________________________  We have given you samples of the following medication to take: Clenpiq Lot No [id number] Exp date 06-16-2023  You have been scheduled for a colonoscopy. Please follow written instructions given to you at your visit today.  Please pick up your prep supplies at the pharmacy within the next 1-3 days. If you use inhalers (even only as needed), please bring them with you on the day of your procedure.  04-01-2022 Two days before your procedure: Mix 3 packs (or capfuls) of Miralax in 48 ounces of clear liquid and drink at 6pm.  An order form for your ultrasound have been given to you to take to [facility] to go there and make your appointment   Thank you,  Dr. Jackquline Denmark

## 2022-01-30 DIAGNOSIS — Z86010 Personal history of colonic polyps: Secondary | ICD-10-CM | POA: Diagnosis not present

## 2022-01-30 DIAGNOSIS — R1011 Right upper quadrant pain: Secondary | ICD-10-CM | POA: Diagnosis not present

## 2022-01-30 DIAGNOSIS — K581 Irritable bowel syndrome with constipation: Secondary | ICD-10-CM | POA: Diagnosis not present

## 2022-02-02 DIAGNOSIS — I10 Essential (primary) hypertension: Secondary | ICD-10-CM | POA: Diagnosis not present

## 2022-02-04 DIAGNOSIS — Z1231 Encounter for screening mammogram for malignant neoplasm of breast: Secondary | ICD-10-CM | POA: Diagnosis not present

## 2022-03-30 ENCOUNTER — Telehealth: Payer: Self-pay

## 2022-03-30 NOTE — Telephone Encounter (Signed)
Made patient aware that her ultrasound looks fine from [facility] and she voiced understanding

## 2022-04-03 ENCOUNTER — Encounter: Payer: Medicare Other | Admitting: Gastroenterology

## 2022-04-06 ENCOUNTER — Encounter: Payer: Self-pay | Admitting: Gastroenterology

## 2022-05-19 ENCOUNTER — Encounter: Payer: Self-pay | Admitting: Gastroenterology

## 2022-08-14 ENCOUNTER — Telehealth: Payer: Self-pay | Admitting: Gastroenterology

## 2022-08-14 NOTE — Telephone Encounter (Signed)
Patient called in to inform Dr. Chales Abrahams that she has been diagnosed with thyroid cancer & is scheduled to see doctor on 7/15 to discuss plan. She previously cancelled her colonoscopy back in February d/t family emergency and scheduling conflict, and does not want to proceed if she will have to be given anesthesia twice. She would like to see if a cologuard would be an option. Will route to MD.

## 2022-08-14 NOTE — Telephone Encounter (Signed)
Inbound call from patient requesting a call back regarding colonoscopy. Stated she had an appointment with her provider from the [facility] and found out information. Requesting a call back to discuss how to proceed with new information. Please advise, thank you.

## 2022-08-17 NOTE — Telephone Encounter (Signed)
Hold off on colonoscopy or Cologuard Lets get thyroid issue taken care of first RG

## 2022-08-18 NOTE — Telephone Encounter (Signed)
Spoke with patient regarding MD recommendations. She will give [place] a call once she has completed thyroid treatment.

## 2023-05-20 ENCOUNTER — Telehealth: Payer: Self-pay | Admitting: Gastroenterology

## 2023-05-20 NOTE — Telephone Encounter (Signed)
 Pt stated that stating on Sunday that she was having nausea, diarrhea and abdominal pain. Pt stated that she had some vomiting on Monday. Pt stated that she has not had any vomiting since Monday, Normal BM on Tuesday, no BM today. Pt stated that she is feeling better today. Pt stated that she had [employer] chicken tenders yesterday evening. Pt was recommended to stay on the BRAT diet, drink lots of fluids. Pt has a previously scheduled office visit on 05/27/2023 at 11:00 with [facility] NP. Pt verbalized understanding with all questions answered.

## 2023-05-20 NOTE — Telephone Encounter (Signed)
 Inbound call from patient stating since Sunday evening she has been vomiting, having diarrhea, and lower abdominal pain. Concerned it may be a bowel blockage or an infection. Patient is currently scheduled for 4/10. Requesting a call to discuss options to help in the meantime. Please advise, thank you.

## 2023-05-27 ENCOUNTER — Other Ambulatory Visit (INDEPENDENT_AMBULATORY_CARE_PROVIDER_SITE_OTHER)

## 2023-05-27 ENCOUNTER — Ambulatory Visit
Admission: RE | Admit: 2023-05-27 | Discharge: 2023-05-27 | Disposition: A | Discharge status: Home / Self Care | Source: Ambulatory Visit | Attending: Gastroenterology

## 2023-05-27 ENCOUNTER — Encounter: Payer: Self-pay | Admitting: Gastroenterology

## 2023-05-27 ENCOUNTER — Ambulatory Visit: Admitting: Gastroenterology

## 2023-05-27 VITALS — BP 110/70 | HR 65 | Ht 65.0 in | Wt 152.0 lb

## 2023-05-27 DIAGNOSIS — K5900 Constipation, unspecified: Secondary | ICD-10-CM

## 2023-05-27 DIAGNOSIS — R197 Diarrhea, unspecified: Secondary | ICD-10-CM

## 2023-05-27 DIAGNOSIS — K581 Irritable bowel syndrome with constipation: Secondary | ICD-10-CM | POA: Diagnosis not present

## 2023-05-27 DIAGNOSIS — R112 Nausea with vomiting, unspecified: Secondary | ICD-10-CM

## 2023-05-27 DIAGNOSIS — Z860101 Personal history of adenomatous and serrated colon polyps: Secondary | ICD-10-CM

## 2023-05-27 DIAGNOSIS — Z860100 Personal history of colon polyps, unspecified: Secondary | ICD-10-CM

## 2023-05-27 LAB — CBC WITH DIFFERENTIAL/PLATELET
Basophils Absolute: 0.1 10*3/uL (ref 0.0–0.1)
Basophils Relative: 1 % (ref 0.0–3.0)
Eosinophils Absolute: 0.2 10*3/uL (ref 0.0–0.7)
Eosinophils Relative: 3.2 % (ref 0.0–5.0)
HCT: 45.1 % (ref 36.0–46.0)
Hemoglobin: 15.3 g/dL — ABNORMAL HIGH (ref 12.0–15.0)
Lymphocytes Relative: 20.3 % (ref 12.0–46.0)
Lymphs Abs: 1.4 10*3/uL (ref 0.7–4.0)
MCHC: 34 g/dL (ref 30.0–36.0)
MCV: 84.5 fl (ref 78.0–100.0)
Monocytes Absolute: 0.6 10*3/uL (ref 0.1–1.0)
Monocytes Relative: 8.3 % (ref 3.0–12.0)
Neutro Abs: 4.8 10*3/uL (ref 1.4–7.7)
Neutrophils Relative %: 67.2 % (ref 43.0–77.0)
Platelets: 278 10*3/uL (ref 150.0–400.0)
RBC: 5.34 Mil/uL — ABNORMAL HIGH (ref 3.87–5.11)
RDW: 14.1 % (ref 11.5–15.5)
WBC: 7.1 10*3/uL (ref 4.0–10.5)

## 2023-05-27 LAB — BASIC METABOLIC PANEL WITH GFR
BUN: 7 mg/dL (ref 6–23)
CO2: 32 meq/L (ref 19–32)
Calcium: 9.1 mg/dL (ref 8.4–10.5)
Chloride: 103 meq/L (ref 96–112)
Creatinine, Ser: 0.89 mg/dL (ref 0.40–1.20)
GFR: 62.42 mL/min (ref >60.00)
Glucose, Bld: 144 mg/dL — ABNORMAL HIGH (ref 70–99)
Potassium: 4.1 meq/L (ref 3.5–5.1)
Sodium: 142 meq/L (ref 135–145)

## 2023-05-27 LAB — MAGNESIUM: Magnesium: 2 mg/dL (ref 1.5–2.5)

## 2023-05-27 NOTE — Patient Instructions (Signed)
 Your provider has requested that you go to the basement level for lab work before leaving today. Press "B" on the elevator. The lab is located at the first door on the left as you exit the elevator.  Due to recent changes in healthcare laws, you may see the results of your imaging and laboratory studies on MyChart before your provider has had a chance to review them.  We understand that in some cases there may be results that are confusing or concerning to you. Not all laboratory results come back in the same time frame and the provider may be waiting for multiple results in order to interpret others.  Please give [place] 48 hours in order for your provider to thoroughly review all the results before contacting the office for clarification of your results.   Please go to the x-ray department today before leaving.  I appreciate the opportunity to care for you. Deanna May , NP
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
## 2023-06-13 NOTE — Progress Notes (Signed)
 Agree with assessment/plan.  Edman Circle, MD [facility] [phone]

## 2023-06-16 ENCOUNTER — Telehealth: Payer: Self-pay | Admitting: *Deleted

## 2023-06-16 NOTE — Telephone Encounter (Signed)
 Reviewed with patient her lab results and abdominal x-ray. Patient voiced understanding.
